# Patient Record
Sex: Female | Born: 2000 | Race: White | Hispanic: No | Marital: Single | Smoking: Former smoker
Health system: Southern US, Community
[De-identification: ages and names within clinical notes are randomized; demographics above are authoritative.]

## PROBLEM LIST (undated history)

## (undated) DIAGNOSIS — F909 Attention-deficit hyperactivity disorder, unspecified type: Secondary | ICD-10-CM

## (undated) DIAGNOSIS — F419 Anxiety disorder, unspecified: Secondary | ICD-10-CM

## (undated) DIAGNOSIS — F319 Bipolar disorder, unspecified: Secondary | ICD-10-CM

## (undated) DIAGNOSIS — G43909 Migraine, unspecified, not intractable, without status migrainosus: Secondary | ICD-10-CM

## (undated) HISTORY — DX: Bipolar disorder, unspecified: F31.9

## (undated) HISTORY — DX: Anxiety disorder, unspecified: F41.9

## (undated) HISTORY — PX: TONSILLECTOMY: SUR1361

## (undated) HISTORY — PX: WISDOM TOOTH EXTRACTION: SHX21

## (undated) HISTORY — DX: Migraine, unspecified, not intractable, without status migrainosus: G43.909

---
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
## 2021-01-09 ENCOUNTER — Ambulatory Visit (INDEPENDENT_AMBULATORY_CARE_PROVIDER_SITE_OTHER): Payer: Medicaid Other | Admitting: Family Medicine

## 2021-01-09 ENCOUNTER — Other Ambulatory Visit: Payer: Self-pay

## 2021-01-09 ENCOUNTER — Encounter: Payer: Self-pay | Admitting: Family Medicine

## 2021-01-09 VITALS — BP 118/70 | HR 77 | Temp 97.7°F | Ht 62.0 in | Wt 112.4 lb

## 2021-01-09 DIAGNOSIS — Z113 Encounter for screening for infections with a predominantly sexual mode of transmission: Secondary | ICD-10-CM

## 2021-01-09 DIAGNOSIS — Z3041 Encounter for surveillance of contraceptive pills: Secondary | ICD-10-CM

## 2021-01-09 MED ORDER — DROSPIRENONE-ETHINYL ESTRADIOL 3-0.02 MG PO TABS: 1.0000 | ORAL_TABLET | Freq: Every day | ORAL | 3 refills | Status: DC
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
## 2021-01-09 NOTE — Patient Instructions (Addendum)
Schedule lab appt for testing   Heating pad 10-15 min 2-3x/day Ibuprofen 600mg  2x/day with food x 5 days then as needed

## 2021-01-10 ENCOUNTER — Encounter: Payer: Self-pay | Admitting: Family Medicine

## 2021-01-21 ENCOUNTER — Telehealth: Payer: Self-pay | Admitting: Family Medicine
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
## 2021-01-22 ENCOUNTER — Encounter: Payer: Self-pay | Admitting: Family Medicine

## 2021-01-22 NOTE — Telephone Encounter (Signed)
Yes I will need providers office note from [facility] along with all xray and lab results. MRI will likely not be covered unless abnormal xray or abnormal neuro findings on exam

## 2021-01-22 NOTE — Telephone Encounter (Signed)
Patient's mother notified VIA phone and will drop off the information to [place] so provider can review them and make a decision form there.  Dm/cma

## 2021-01-22 NOTE — Telephone Encounter (Signed)
Spoke to patient's mother, Morrie Sheldon, again and she was very aggrivated that she is having to "jump through hoops" to get her daughter taking care of. Advised her that I am just relaying the information given to me.  She doesn't understand why we can't just order the MRI and not worry about the insurance claim information.   She will bring the information to the office so provider can have to order the MRI and she states that "she will be changing providers".  Dm/cma

## 2021-01-23 NOTE — Telephone Encounter (Signed)
Danford Bad,  Pt also sent myChart message to switch to Dr. Carmelia Roller which I forwarded to you.  I am fine with her to switch PCP.  It is more than an insurance issue with the MRI. The question is if it is medically necessary. If there is an abnormality on xray or pt has abnormal nuero exam, I have no issue with MRI. If not, then she likely needs more conservative treatment - meds, PT, etc - for a few weeks then re-assessment.

## 2021-01-23 NOTE — Telephone Encounter (Signed)
Ok for pt to schedule with Dr. Carmelia Roller.

## 2021-01-24 NOTE — Telephone Encounter (Signed)
Denise and I discussed that and I believe Denised advised the mother of it as well. Pt is scheduled 01/29/2021 with Dr. Carmelia Roller.

## 2021-01-29 ENCOUNTER — Ambulatory Visit (INDEPENDENT_AMBULATORY_CARE_PROVIDER_SITE_OTHER): Payer: Medicaid Other | Admitting: Family Medicine

## 2021-01-29 ENCOUNTER — Encounter: Payer: Self-pay | Admitting: Family Medicine

## 2021-01-29 ENCOUNTER — Other Ambulatory Visit: Payer: Self-pay

## 2021-01-29 VITALS — BP 110/76 | HR 91 | Temp 98.3°F | Ht 62.0 in | Wt 112.4 lb

## 2021-01-29 DIAGNOSIS — M545 Low back pain, unspecified: Secondary | ICD-10-CM

## 2021-01-29 DIAGNOSIS — M542 Cervicalgia: Secondary | ICD-10-CM | POA: Diagnosis not present

## 2021-01-29 MED ORDER — MELOXICAM 15 MG PO TABS: 15.0000 mg | ORAL_TABLET | Freq: Every day | ORAL | 0 refills | Status: DC

## 2021-01-29 NOTE — Patient Instructions (Addendum)
Heat (pad or rice pillow in microwave) over affected area, 10-15 minutes twice daily.   Ice/cold pack over area for 10-15 min twice daily.  OK to take Tylenol 1000 mg (2 extra strength tabs) or 975 mg (3 regular strength tabs) every 6 hours as needed.  Take Flexeril (cyclobenzaprine) 1-2 hours before planned bedtime. If it makes you drowsy, do not take during the day. You can try half a tab the following night.  Let us know if you need anything.  EXERCISES RANGE OF MOTION (ROM) AND STRETCHING EXERCISES  These exercises may help you when beginning to rehabilitate your issue. In order to successfully resolve your symptoms, you must improve your posture. These exercises are designed to help reduce the forward-head and rounded-shoulder posture which contributes to this condition. Your symptoms may resolve with or without further involvement from your physician, physical therapist or athletic trainer. While completing these exercises, remember:   Restoring tissue flexibility helps normal motion to return to the joints. This allows healthier, less painful movement and activity.  An effective stretch should be held for at least 20 seconds, although you may need to begin with shorter hold times for comfort.  A stretch should never be painful. You should only feel a gentle lengthening or release in the stretched tissue.  Do not do any stretch or exercise that you cannot tolerate.  STRETCH- Axial Extensors  Lie on your back on the floor. You may bend your knees for comfort. Place a rolled-up hand towel or dish towel, about 2 inches in diameter, under the part of your head that makes contact with the floor.  Gently tuck your chin, as if trying to make a "double chin," until you feel a gentle stretch at the base of your head.  Hold 15-20 seconds. Repeat 2-3 times. Complete this exercise 1 time per day.   STRETCH - Axial Extension   Stand or sit on a firm surface. Assume a good posture: chest up,  shoulders drawn back, abdominal muscles slightly tense, knees unlocked (if standing) and feet hip width apart.  Slowly retract your chin so your head slides back and your chin slightly lowers. Continue to look straight ahead.  You should feel a gentle stretch in the back of your head. Be certain not to feel an aggressive stretch since this can cause headaches later.  Hold for 15-20 seconds. Repeat 2-3 times. Complete this exercise 1 time per day.  STRETCH - Cervical Side Bend   Stand or sit on a firm surface. Assume a good posture: chest up, shoulders drawn back, abdominal muscles slightly tense, knees unlocked (if standing) and feet hip width apart.  Without letting your nose or shoulders move, slowly tip your right / left ear to your shoulder until your feel a gentle stretch in the muscles on the opposite side of your neck.  Hold 15-20 seconds. Repeat 2-3 times. Complete this exercise 1-2 times per day.  STRETCH - Cervical Rotators   Stand or sit on a firm surface. Assume a good posture: chest up, shoulders drawn back, abdominal muscles slightly tense, knees unlocked (if standing) and feet hip width apart.  Keeping your eyes level with the ground, slowly turn your head until you feel a gentle stretch along the back and opposite side of your neck.  Hold 15-20 seconds. Repeat 2-3 times. Complete this exercise 1-2 times per day.  RANGE OF MOTION - Neck Circles   Stand or sit on a firm surface. Assume a good posture: chest  up, shoulders drawn back, abdominal muscles slightly tense, knees unlocked (if standing) and feet hip width apart.  Gently roll your head down and around from the back of one shoulder to the back of the other. The motion should never be forced or painful.  Repeat the motion 10-20 times, or until you feel the neck muscles relax and loosen. Repeat 2-3 times. Complete the exercise 1-2 times per day. STRENGTHENING EXERCISES - Cervical Strain and Sprain These exercises  may help you when beginning to rehabilitate your injury. They may resolve your symptoms with or without further involvement from your physician, physical therapist, or athletic trainer. While completing these exercises, remember:   Muscles can gain both the endurance and the strength needed for everyday activities through controlled exercises.  Complete these exercises as instructed by your physician, physical therapist, or athletic trainer. Progress the resistance and repetitions only as guided.  You may experience muscle soreness or fatigue, but the pain or discomfort you are trying to eliminate should never worsen during these exercises. If this pain does worsen, stop and make certain you are following the directions exactly. If the pain is still present after adjustments, discontinue the exercise until you can discuss the trouble with your clinician.  STRENGTH - Cervical Flexors, Isometric  Face a wall, standing about 6 inches away. Place a small pillow, a ball about 6-8 inches in diameter, or a folded towel between your forehead and the wall.  Slightly tuck your chin and gently push your forehead into the soft object. Push only with mild to moderate intensity, building up tension gradually. Keep your jaw and forehead relaxed.  Hold 10 to 20 seconds. Keep your breathing relaxed.  Release the tension slowly. Relax your neck muscles completely before you start the next repetition. Repeat 2-3 times. Complete this exercise 1 time per day.  STRENGTH- Cervical Lateral Flexors, Isometric   Stand about 6 inches away from a wall. Place a small pillow, a ball about 6-8 inches in diameter, or a folded towel between the side of your head and the wall.  Slightly tuck your chin and gently tilt your head into the soft object. Push only with mild to moderate intensity, building up tension gradually. Keep your jaw and forehead relaxed.  Hold 10 to 20 seconds. Keep your breathing relaxed.  Release the  tension slowly. Relax your neck muscles completely before you start the next repetition. Repeat 2-3 times. Complete this exercise 1 time per day.  STRENGTH - Cervical Extensors, Isometric   Stand about 6 inches away from a wall. Place a small pillow, a ball about 6-8 inches in diameter, or a folded towel between the back of your head and the wall.  Slightly tuck your chin and gently tilt your head back into the soft object. Push only with mild to moderate intensity, building up tension gradually. Keep your jaw and forehead relaxed.  Hold 10 to 20 seconds. Keep your breathing relaxed.  Release the tension slowly. Relax your neck muscles completely before you start the next repetition. Repeat 2-3 times. Complete this exercise 1 time per day.  POSTURE AND BODY MECHANICS CONSIDERATIONS Keeping correct posture when sitting, standing or completing your activities will reduce the stress put on different body tissues, allowing injured tissues a chance to heal and limiting painful experiences. The following are general guidelines for improved posture. Your physician or physical therapist will provide you with any instructions specific to your needs. While reading these guidelines, remember:  The exercises prescribed by  your provider will help you have the flexibility and strength to maintain correct postures.  The correct posture provides the optimal environment for your joints to work. All of your joints have less wear and tear when properly supported by a spine with good posture. This means you will experience a healthier, less painful body.  Correct posture must be practiced with all of your activities, especially prolonged sitting and standing. Correct posture is as important when doing repetitive low-stress activities (typing) as it is when doing a single heavy-load activity (lifting).  PROLONGED STANDING WHILE SLIGHTLY LEANING FORWARD When completing a task that requires you to lean forward  while standing in one place for a long time, place either foot up on a stationary 2- to 4-inch high object to help maintain the best posture. When both feet are on the ground, the low back tends to lose its slight inward curve. If this curve flattens (or becomes too large), then the back and your other joints will experience too much stress, fatigue more quickly, and can cause pain.   RESTING POSITIONS Consider which positions are most painful for you when choosing a resting position. If you have pain with flexion-based activities (sitting, bending, stooping, squatting), choose a position that allows you to rest in a less flexed posture. You would want to avoid curling into a fetal position on your side. If your pain worsens with extension-based activities (prolonged standing, working overhead), avoid resting in an extended position such as sleeping on your stomach. Most people will find more comfort when they rest with their spine in a more neutral position, neither too rounded nor too arched. Lying on a non-sagging bed on your side with a pillow between your knees, or on your back with a pillow under your knees will often provide some relief. Keep in mind, being in any one position for a prolonged period of time, no matter how correct your posture, can still lead to stiffness.  WALKING Walk with an upright posture. Your ears, shoulders, and hips should all line up. OFFICE WORK When working at a desk, create an environment that supports good, upright posture. Without extra support, muscles fatigue and lead to excessive strain on joints and other tissues.  CHAIR:  A chair should be able to slide under your desk when your back makes contact with the back of the chair. This allows you to work closely.  The chair's height should allow your eyes to be level with the upper part of your monitor and your hands to be slightly lower than your elbows.  Body position: ? Your feet should make contact with the  floor. If this is not possible, use a foot rest. ? Keep your ears over your shoulders. This will reduce stress on your neck and low back.  EXERCISES  RANGE OF MOTION (ROM) AND STRETCHING EXERCISES - Low Back Pain Most people with lower back pain will find that their symptoms get worse with excessive bending forward (flexion) or arching at the lower back (extension). The exercises that will help resolve your symptoms will focus on the opposite motion.  If you have pain, numbness or tingling which travels down into your buttocks, leg or foot, the goal of the therapy is for these symptoms to move closer to your back and eventually resolve. Sometimes, these leg symptoms will get better, but your lower back pain may worsen. This is often an indication of progress in your rehabilitation. Be very alert to any changes in your symptoms and  the activities in which you participated in the 24 hours prior to the change. Sharing this information with your caregiver will allow him or her to most efficiently treat your condition. These exercises may help you when beginning to rehabilitate your injury. Your symptoms may resolve with or without further involvement from your physician, physical therapist or athletic trainer. While completing these exercises, remember:   Restoring tissue flexibility helps normal motion to return to the joints. This allows healthier, less painful movement and activity.  An effective stretch should be held for at least 30 seconds.  A stretch should never be painful. You should only feel a gentle lengthening or release in the stretched tissue. FLEXION RANGE OF MOTION AND STRETCHING EXERCISES:  STRETCH - Flexion, Single Knee to Chest   Lie on a firm bed or floor with both legs extended in front of you.  Keeping one leg in contact with the floor, bring your opposite knee to your chest. Hold your leg in place by either grabbing behind your thigh or at your knee.  Pull until you feel a  gentle stretch in your low back. Hold 30 seconds.  Slowly release your grasp and repeat the exercise with the opposite side. Repeat 2 times. Complete this exercise 3 times per week.   STRETCH - Flexion, Double Knee to Chest  Lie on a firm bed or floor with both legs extended in front of you.  Keeping one leg in contact with the floor, bring your opposite knee to your chest.  Tense your stomach muscles to support your back and then lift your other knee to your chest. Hold your legs in place by either grabbing behind your thighs or at your knees.  Pull both knees toward your chest until you feel a gentle stretch in your low back. Hold 30 seconds.  Tense your stomach muscles and slowly return one leg at a time to the floor. Repeat 2 times. Complete this exercise 3 times per week.   STRETCH - Low Trunk Rotation  Lie on a firm bed or floor. Keeping your legs in front of you, bend your knees so they are both pointed toward the ceiling and your feet are flat on the floor.  Extend your arms out to the side. This will stabilize your upper body by keeping your shoulders in contact with the floor.  Gently and slowly drop both knees together to one side until you feel a gentle stretch in your low back. Hold for 30 seconds.  Tense your stomach muscles to support your lower back as you bring your knees back to the starting position. Repeat the exercise to the other side. Repeat 2 times. Complete this exercise at least 3 times per week.   EXTENSION RANGE OF MOTION AND FLEXIBILITY EXERCISES:  STRETCH - Extension, Prone on Elbows   Lie on your stomach on the floor, a bed will be too soft. Place your palms about shoulder width apart and at the height of your head.  Place your elbows under your shoulders. If this is too painful, stack pillows under your chest.  Allow your body to relax so that your hips drop lower and make contact more completely with the floor.  Hold this position for 30  seconds.  Slowly return to lying flat on the floor. Repeat 2 times. Complete this exercise 3 times per week.   RANGE OF MOTION - Extension, Prone Press Ups  Lie on your stomach on the floor, a bed will be too soft.  Place your palms about shoulder width apart and at the height of your head.  Keeping your back as relaxed as possible, slowly straighten your elbows while keeping your hips on the floor. You may adjust the placement of your hands to maximize your comfort. As you gain motion, your hands will come more underneath your shoulders.  Hold this position 30 seconds.  Slowly return to lying flat on the floor. Repeat 2 times. Complete this exercise 3 times per week.   RANGE OF MOTION- Quadruped, Neutral Spine   Assume a hands and knees position on a firm surface. Keep your hands under your shoulders and your knees under your hips. You may place padding under your knees for comfort.  Drop your head and point your tailbone toward the ground below you. This will round out your lower back like an angry cat. Hold this position for 30 seconds.  Slowly lift your head and release your tail bone so that your back sags into a large arch, like an old horse.  Hold this position for 30 seconds.  Repeat this until you feel limber in your low back.  Now, find your "sweet spot." This will be the most comfortable position somewhere between the two previous positions. This is your neutral spine. Once you have found this position, tense your stomach muscles to support your low back.  Hold this position for 30 seconds. Repeat 2 times. Complete this exercise 3 times per week.   STRENGTHENING EXERCISES - Low Back Sprain These exercises may help you when beginning to rehabilitate your injury. These exercises should be done near your "sweet spot." This is the neutral, low-back arch, somewhere between fully rounded and fully arched, that is your least painful position. When performed in this safe range of  motion, these exercises can be used for people who have either a flexion or extension based injury. These exercises may resolve your symptoms with or without further involvement from your physician, physical therapist or athletic trainer. While completing these exercises, remember:   Muscles can gain both the endurance and the strength needed for everyday activities through controlled exercises.  Complete these exercises as instructed by your physician, physical therapist or athletic trainer. Increase the resistance and repetitions only as guided.  You may experience muscle soreness or fatigue, but the pain or discomfort you are trying to eliminate should never worsen during these exercises. If this pain does worsen, stop and make certain you are following the directions exactly. If the pain is still present after adjustments, discontinue the exercise until you can discuss the trouble with your caregiver.  STRENGTHENING - Deep Abdominals, Pelvic Tilt   Lie on a firm bed or floor. Keeping your legs in front of you, bend your knees so they are both pointed toward the ceiling and your feet are flat on the floor.  Tense your lower abdominal muscles to press your low back into the floor. This motion will rotate your pelvis so that your tail bone is scooping upwards rather than pointing at your feet or into the floor. With a gentle tension and even breathing, hold this position for 3 seconds. Repeat 2 times. Complete this exercise 3 times per week.   STRENGTHENING - Abdominals, Crunches   Lie on a firm bed or floor. Keeping your legs in front of you, bend your knees so they are both pointed toward the ceiling and your feet are flat on the floor. Cross your arms over your chest.  Slightly tip your chin  down without bending your neck.  Tense your abdominals and slowly lift your trunk high enough to just clear your shoulder blades. Lifting higher can put excessive stress on the lower back and does not  further strengthen your abdominal muscles.  Control your return to the starting position. Repeat 2 times. Complete this exercise 3 times per week.   STRENGTHENING - Quadruped, Opposite UE/LE Lift   Assume a hands and knees position on a firm surface. Keep your hands under your shoulders and your knees under your hips. You may place padding under your knees for comfort.  Find your neutral spine and gently tense your abdominal muscles so that you can maintain this position. Your shoulders and hips should form a rectangle that is parallel with the floor and is not twisted.  Keeping your trunk steady, lift your right hand no higher than your shoulder and then your left leg no higher than your hip. Make sure you are not holding your breath. Hold this position for 30 seconds.  Continuing to keep your abdominal muscles tense and your back steady, slowly return to your starting position. Repeat with the opposite arm and leg. Repeat 2 times. Complete this exercise 3 times per week.   STRENGTHENING - Abdominals and Quadriceps, Straight Leg Raise   Lie on a firm bed or floor with both legs extended in front of you.  Keeping one leg in contact with the floor, bend the other knee so that your foot can rest flat on the floor.  Find your neutral spine, and tense your abdominal muscles to maintain your spinal position throughout the exercise.  Slowly lift your straight leg off the floor about 6 inches for a count of 3, making sure to not hold your breath.  Still keeping your neutral spine, slowly lower your leg all the way to the floor. Repeat this exercise with each leg 2 times. Complete this exercise 3 times per week.  POSTURE AND BODY MECHANICS CONSIDERATIONS - Low Back Sprain Keeping correct posture when sitting, standing or completing your activities will reduce the stress put on different body tissues, allowing injured tissues a chance to heal and limiting painful experiences. The following are  general guidelines for improved posture.  While reading these guidelines, remember:  The exercises prescribed by your provider will help you have the flexibility and strength to maintain correct postures.  The correct posture provides the best environment for your joints to work. All of your joints have less wear and tear when properly supported by a spine with good posture. This means you will experience a healthier, less painful body.  Correct posture must be practiced with all of your activities, especially prolonged sitting and standing. Correct posture is as important when doing repetitive low-stress activities (typing) as it is when doing a single heavy-load activity (lifting).  RESTING POSITIONS Consider which positions are most painful for you when choosing a resting position. If you have pain with flexion-based activities (sitting, bending, stooping, squatting), choose a position that allows you to rest in a less flexed posture. You would want to avoid curling into a fetal position on your side. If your pain worsens with extension-based activities (prolonged standing, working overhead), avoid resting in an extended position such as sleeping on your stomach. Most people will find more comfort when they rest with their spine in a more neutral position, neither too rounded nor too arched. Lying on a non-sagging bed on your side with a pillow between your knees, or on your  back with a pillow under your knees will often provide some relief. Keep in mind, being in any one position for a prolonged period of time, no matter how correct your posture, can still lead to stiffness.  PROPER SITTING POSTURE In order to minimize stress and discomfort on your spine, you must sit with correct posture. Sitting with good posture should be effortless for a healthy body. Returning to good posture is a gradual process. Many people can work toward this most comfortably by using various supports until they have the  flexibility and strength to maintain this posture on their own. When sitting with proper posture, your ears will fall over your shoulders and your shoulders will fall over your hips. You should use the back of the chair to support your upper back. Your lower back will be in a neutral position, just slightly arched. You may place a small pillow or folded towel at the base of your lower back for  support.  When working at a desk, create an environment that supports good, upright posture. Without extra support, muscles tire, which leads to excessive strain on joints and other tissues. Keep these recommendations in mind:  CHAIR:  A chair should be able to slide under your desk when your back makes contact with the back of the chair. This allows you to work closely.  The chair's height should allow your eyes to be level with the upper part of your monitor and your hands to be slightly lower than your elbows.  BODY POSITION  Your feet should make contact with the floor. If this is not possible, use a foot rest.  Keep your ears over your shoulders. This will reduce stress on your neck and low back.  INCORRECT SITTING POSTURES  If you are feeling tired and unable to assume a healthy sitting posture, do not slouch or slump. This puts excessive strain on your back tissues, causing more damage and pain. Healthier options include:  Using more support, like a lumbar pillow.  Switching tasks to something that requires you to be upright or walking.  Talking a brief walk.  Lying down to rest in a neutral-spine position.  PROLONGED STANDING WHILE SLIGHTLY LEANING FORWARD  When completing a task that requires you to lean forward while standing in one place for a long time, place either foot up on a stationary 2-4 inch high object to help maintain the best posture. When both feet are on the ground, the lower back tends to lose its slight inward curve. If this curve flattens (or becomes too large), then the  back and your other joints will experience too much stress, tire more quickly, and can cause pain.  CORRECT STANDING POSTURES Proper standing posture should be assumed with all daily activities, even if they only take a few moments, like when brushing your teeth. As in sitting, your ears should fall over your shoulders and your shoulders should fall over your hips. You should keep a slight tension in your abdominal muscles to brace your spine. Your tailbone should point down to the ground, not behind your body, resulting in an over-extended swayback posture.   INCORRECT STANDING POSTURES  Common incorrect standing postures include a forward head, locked knees and/or an excessive swayback. WALKING Walk with an upright posture. Your ears, shoulders and hips should all line-up.  PROLONGED ACTIVITY IN A FLEXED POSITION When completing a task that requires you to bend forward at your waist or lean over a low surface, try to find  a way to stabilize 3 out of 4 of your limbs. You can place a hand or elbow on your thigh or rest a knee on the surface you are reaching across. This will provide you more stability, so that your muscles do not tire as quickly. By keeping your knees relaxed, or slightly bent, you will also reduce stress across your lower back. CORRECT LIFTING TECHNIQUES  DO :  Assume a wide stance. This will provide you more stability and the opportunity to get as close as possible to the object which you are lifting.  Tense your abdominals to brace your spine. Bend at the knees and hips. Keeping your back locked in a neutral-spine position, lift using your leg muscles. Lift with your legs, keeping your back straight.  Test the weight of unknown objects before attempting to lift them.  Try to keep your elbows locked down at your sides in order get the best strength from your shoulders when carrying an object.     Always ask for help when lifting heavy or awkward objects. INCORRECT  LIFTING TECHNIQUES DO NOT:   Lock your knees when lifting, even if it is a small object.  Bend and twist. Pivot at your feet or move your feet when needing to change directions.  Assume that you can safely pick up even a paperclip without proper posture.
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
## 2021-01-31 ENCOUNTER — Ambulatory Visit: Payer: Medicaid Other | Attending: Family Medicine | Admitting: Physical Therapy

## 2021-01-31 ENCOUNTER — Encounter: Payer: Self-pay | Admitting: Physical Therapy

## 2021-01-31 ENCOUNTER — Other Ambulatory Visit: Payer: Self-pay

## 2021-01-31 DIAGNOSIS — R262 Difficulty in walking, not elsewhere classified: Secondary | ICD-10-CM | POA: Diagnosis present

## 2021-01-31 DIAGNOSIS — M6281 Muscle weakness (generalized): Secondary | ICD-10-CM | POA: Insufficient documentation

## 2021-01-31 DIAGNOSIS — M542 Cervicalgia: Secondary | ICD-10-CM | POA: Diagnosis present

## 2021-01-31 DIAGNOSIS — M545 Low back pain, unspecified: Secondary | ICD-10-CM | POA: Diagnosis present

## 2021-01-31 DIAGNOSIS — R293 Abnormal posture: Secondary | ICD-10-CM | POA: Insufficient documentation
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
## 2021-02-07 ENCOUNTER — Ambulatory Visit: Payer: Medicaid Other

## 2021-02-07 ENCOUNTER — Other Ambulatory Visit: Payer: Self-pay

## 2021-02-07 DIAGNOSIS — M542 Cervicalgia: Secondary | ICD-10-CM

## 2021-02-07 DIAGNOSIS — R293 Abnormal posture: Secondary | ICD-10-CM

## 2021-02-07 DIAGNOSIS — M545 Low back pain, unspecified: Secondary | ICD-10-CM

## 2021-02-07 DIAGNOSIS — R262 Difficulty in walking, not elsewhere classified: Secondary | ICD-10-CM

## 2021-02-07 DIAGNOSIS — M6281 Muscle weakness (generalized): Secondary | ICD-10-CM

## 2021-02-07 NOTE — Therapy (Signed)
[facility] [address]  [address] [place], [place], [address] Phone: [phone]   Fax:  [phone]  Physical Therapy Treatment  Patient Details  Name: Autumn Cortez MRN: [id number] Date of Birth: 2001/11/04 Referring Provider (PT): Arva Chafe, [place]   Encounter Date: 02/07/2021   PT End of Session - 02/07/21 1828    Visit Number 2    Number of Visits 13    Date for PT Re-Evaluation 03/14/21    Authorization Type UHC Medicaid    PT Start Time 1616    PT Stop Time 1705    PT Time Calculation (min) 49 min    Activity Tolerance Patient tolerated treatment well;Patient limited by pain    Behavior During Therapy [facility] for tasks assessed/performed           Past Medical History:  Diagnosis Date  . ADD (attention deficit disorder with hyperactivity)   . Anxiety   . Asthma   . Bipolar 1 disorder (HCC)   . Migraines     Past Surgical History:  Procedure Laterality Date  . ADENOIDECTOMY  8/11  . TONSILLECTOMY      There were no vitals filed for this visit.   Subjective Assessment - 02/07/21 1623    Subjective Pt reports she has been doing the exercises but some pain in the neck with exercises. Had some questions about home program.    Pertinent History migraines, bipolar 1, asthma, anxiety, ADD    Limitations Lifting;Standing;Walking;House hold activities;Writing    How long can you sit comfortably? unlimited    How long can you stand comfortably? 1 hour    How long can you walk comfortably? 1 hour    Diagnostic tests per patient- xrays were done but unable to access    Patient Stated Goals decrease pain    Currently in Pain? Yes    Pain Score 6     Pain Location Neck    Pain Orientation Right;Left;Mid    Pain Descriptors / Indicators Sharp;Sore    Pain Type Acute pain                             [facility] Adult PT Treatment/Exercise - 02/07/21 1634      Exercises   Exercises  Neck;Lumbar;Other Exercises      Neck Exercises: Seated   Postural Training Seated in chair with vertical spinal roll and horizontal  lumbar roll for external postural cues. Chin tucks x 10 with difficulty, overactive SCM activity. Horizontal abduction with yellow TB x 5    Other Seated Exercise Cervical AROM 4 way x 10. Cervical extension towel stretch x 10, cervical rotation stretch 5x B.      Neck Exercises: Supine   Other Supine Exercise Cervical rotation AROM x 10 CW, 10 CCW. Chin tucks  x10 with 3" holds and cues to prevent compensatory head lifting using SCMs      Lumbar Exercises: Stretches   Other Lumbar Stretch Exercise Seated prayer stretch with green pball x10 fwd/r/l      Lumbar Exercises: Seated   Other Seated Lumbar Exercises Pelvic tilts x 10 seated on dynadisc      Lumbar Exercises: Supine   Bridge 10 reps   Cues for shorter range movement. Reports tension in shoulders when doing at home- originally doing large range motion wiith some lumbar hyperextension. Repsonded wellto cues.     Lumbar Exercises: Quadruped  Other Quadruped Lumbar Exercises Cat/Camel with cues for scap stabilization and lumbar extension/flexion x 10      Modalities   Modalities Cryotherapy      Cryotherapy   Number Minutes Cryotherapy 5 Minutes    Cryotherapy Location Cervical    Type of Cryotherapy Ice pack      Manual Therapy   Manual Therapy Soft tissue mobilization;Myofascial release    Soft tissue mobilization B UT/LS, suboccipitals with more TTP on the R, B SCM (R>L)                    PT Short Term Goals - 01/31/21 1621      PT SHORT TERM GOAL #1   Title Patient to be independent with initial HEP.    Time 3    Period Weeks    Status New    Target Date 02/21/21             PT Long Term Goals - 01/31/21 1622      PT LONG TERM GOAL #1   Title Patient to be independent with advanced HEP.    Time 6    Period Weeks    Status New    Target Date 03/14/21      PT  LONG TERM GOAL #2   Title Patient to demonstrate B UE and LE strength >/=4+/5.    Time 6    Period Weeks    Status New    Target Date 03/14/21      PT LONG TERM GOAL #3   Title Patient to demonstrate cervical AROM WFL and without pain limiting.    Time 6    Period Weeks    Status New    Target Date 03/14/21      PT LONG TERM GOAL #4   Title Patient to demonstrate lumbar AROM WFL and without pain limiting.    Time 6    Period Weeks    Status New    Target Date 03/14/21      PT LONG TERM GOAL #5   Title Patient to report tolerance for standing and walking without limitation to fully return to school and work duties.    Time 6    Period Weeks    Status New    Target Date 03/14/21                 Plan - 02/07/21 1705    Clinical Impression Statement Autumn Cortez tolerated treatment nicely today. Spent alot of time revieweing HEP. Autumn Cortez required cues for proper performance of scap retractions with emphasis on depression with retraction, Upper trap stretch with cue for slight chin tuck (pt with forward head posture and c/o pain otherwise at 15 seconds, pain not exacerbated after cues).  She demos limited scap staiblization with QP exercises but did well with cues. Introduced supine chin tucks and AROM with good tolerance. Alot of cues and education provided for postural corrections, sitting posture. Very overactive SCMs (R>>L) with TTP at R SCM insertion at mastoid today, responded nicelyto STM and cryo at end of session. Continue per POC    Personal Factors and Comorbidities Age;Comorbidity 3+;Fitness;Past/Current Experience;Profession;Time since onset of injury/illness/exacerbation    Comorbidities migraines, bipolar 1, asthma, anxiety, ADD    Examination-Activity Limitations Sleep;Bed Mobility;Bend;Squat;Stairs;Carry;Stand;Dressing;Transfers;Lift;Reach Overhead;Locomotion Level    Examination-Participation Restrictions Church;Cleaning;School;Shop;Community  Activity;Driving;Volunteer;Yard Work;Laundry;Meal Prep;Occupation    Rehab Potential Good    PT Frequency 2x / week    PT Duration 6 weeks  PT Treatment/Interventions ADLs/Self Care Home Management;Cryotherapy;Electrical Stimulation;Moist Heat;Traction;Balance training;Therapeutic exercise;Therapeutic activities;Functional mobility training;Stair training;Gait training;Ultrasound;Neuromuscular re-education;Patient/family education;Manual techniques;Taping;Energy conservation;Dry needling;Passive range of motion    PT Next Visit Plan continue to reassess HEP; progress UE and LE strength and cervical/lumbar mobility    Consulted and Agree with Plan of Care Patient           Patient will benefit from skilled therapeutic intervention in order to improve the following deficits and impairments:  Hypomobility,Pain,Impaired UE functional use,Increased fascial restricitons,Decreased strength,Decreased activity tolerance,Difficulty walking,Increased muscle spasms,Improper body mechanics,Decreased range of motion,Impaired flexibility,Postural dysfunction  Visit Diagnosis: Cervicalgia  Acute bilateral low back pain without sciatica  Muscle weakness (generalized)  Abnormal posture  Difficulty in walking, not elsewhere classified     Problem List Patient Active Problem List   Diagnosis Date Noted  . 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 Suicide ideation 06/27/2019  . ADHD (attention deficit hyperactivity disorder), inattentive type 06/23/2019  . Overdose 06/23/2019  . Migraine headache 06/23/2019  . MDD (major depressive disorder), recurrent, severe, with psychosis (HCC) 02/22/2018    Anson Crofts , PT, DPT 02/07/2021, 6:36 PM  [facility] [address]  [address] [place], [place], [address] Phone: [phone]   Fax:  [phone]  Name: Autumn Cortez MRN:  [id number] Date of Birth: 01/27/2001

## 2021-02-19 ENCOUNTER — Ambulatory Visit: Payer: Medicaid Other | Admitting: Physical Therapy

## 2021-02-20 ENCOUNTER — Other Ambulatory Visit: Payer: Self-pay

## 2021-02-20 ENCOUNTER — Encounter: Payer: Self-pay | Admitting: Physical Therapy

## 2021-02-20 ENCOUNTER — Ambulatory Visit: Payer: Medicaid Other | Admitting: Physical Therapy

## 2021-02-20 DIAGNOSIS — M542 Cervicalgia: Secondary | ICD-10-CM | POA: Diagnosis not present

## 2021-02-20 DIAGNOSIS — R293 Abnormal posture: Secondary | ICD-10-CM

## 2021-02-20 DIAGNOSIS — R262 Difficulty in walking, not elsewhere classified: Secondary | ICD-10-CM

## 2021-02-20 DIAGNOSIS — M6281 Muscle weakness (generalized): Secondary | ICD-10-CM

## 2021-02-20 DIAGNOSIS — M545 Low back pain, unspecified: Secondary | ICD-10-CM

## 2021-02-20 NOTE — Therapy (Signed)
[facility] [address]  [place], [name], [address] Phone: [phone]   Fax:  [phone]  Physical Therapy Treatment  Patient Details  Name: Autumn Cortez MRN: [id number] Date of Birth: [birth year] Referring Provider (PT): Riki Sheer, [place]   Encounter Date: 02/20/2021   PT End of Session - 02/20/21 1618    Visit Number 3    Number of Visits 13    Date for PT Re-Evaluation 03/14/21    Authorization Type [employer] Medicaid    PT Start Time 4585    PT Stop Time 1629    PT Time Calculation (min) 55 min    Activity Tolerance Patient tolerated treatment well;Patient limited by pain    Behavior During Therapy [facility] for tasks assessed/performed           Past Medical History:  Diagnosis Date  . ADD (attention deficit disorder with hyperactivity)   . Anxiety   . Asthma   . Bipolar 1 disorder ([place])   . Migraines     Past Surgical History:  Procedure Laterality Date  . ADENOIDECTOMY  8/11  . TONSILLECTOMY      There were no vitals filed for this visit.   Subjective Assessment - 02/20/21 1535    Subjective "Getting a little better slowly but surely." Reports consistently performing HEP- denies questions.    Pertinent History migraines, bipolar 1, asthma, anxiety, ADD    Diagnostic tests per patient- xrays were done but unable to access    Patient Stated Goals decrease pain    Currently in Pain? Yes    Pain Score 3     Pain Location Shoulder    Pain Orientation Right;Left;Upper    Pain Descriptors / Indicators Dull    Pain Type Acute pain                             [facility] Adult PT Treatment/Exercise - 02/20/21 0001      Neck Exercises: Machines for Strengthening   UBE (Upper Arm Bike) L1 x 3 min forward, 3 min back   heavy UT use     Lumbar Exercises: Supine   Bridge 10 reps    Bridge Limitations straight leg bridge   slow but controlled     Lumbar Exercises: Sidelying    Hip Abduction Right;Left;10 reps    Hip Abduction Weights (lbs) 1#   cues for alignment & to maintain quad tight   Hip Abduction Limitations R/L with 1#   cues to maintain knee straight     Lumbar Exercises: Quadruped   Other Quadruped Lumbar Exercises Cat/Camel x 10   cues for rhythmic breathing with each movement   Other Quadruped Lumbar Exercises R/L thread the needle x10 each side   good ROM     Modalities   Modalities Cryotherapy;Electrical Stimulation      Cryotherapy   Number Minutes Cryotherapy 10 Minutes    Cryotherapy Location Cervical    Type of Cryotherapy Ice pack      Electrical Stimulation   Electrical Stimulation Location B UT    Electrical Stimulation Action premod    Electrical Stimulation Parameters intensity to tolerance; 10 min    Electrical Stimulation Goals Tone;Pain      Neck Exercises: Stretches   Upper Trapezius Stretch Right;Left;1 rep;30 seconds   cues to depress shoulders   Levator Stretch Right;Left;1 rep;30 seconds  PT Short Term Goals - 02/20/21 1620      PT SHORT TERM GOAL #1   Title Patient to be independent with initial HEP.    Time 3    Period Weeks    Status Achieved    Target Date 02/21/21             PT Long Term Goals - 02/20/21 1539      PT LONG TERM GOAL #1   Title Patient to be independent with advanced HEP.    Time 6    Period Weeks    Status On-going      PT LONG TERM GOAL #2   Title Patient to demonstrate B UE and LE strength >/=4+/5.    Time 6    Period Weeks    Status On-going      PT LONG TERM GOAL #3   Title Patient to demonstrate cervical AROM WFL and without pain limiting.    Time 6    Period Weeks    Status On-going      PT LONG TERM GOAL #4   Title Patient to demonstrate lumbar AROM WFL and without pain limiting.    Time 6    Period Weeks    Status On-going      PT LONG TERM GOAL #5   Title Patient to report tolerance for standing and walking without limitation to  fully return to school and work duties.    Time 6    Period Weeks    Status Partially Met   reporting a couple of hours of standing and walking before increase in pain                Plan - 02/20/21 1619    Clinical Impression Statement Patient reporting slow but steady improvement in symptoms and noting consistent HEP compliance. Lumbopelvic and thoracolumbar AROM was performed with good tolerance- still lacking end ranges of anterior and posterior pelvic tilt. Increased challenge with bridges with care to avoid upper body pain/tension- patient tolerated well and performed with good form. Demonstrated some B adductor weakness with sidelying ther-ex. Patient noted mild tension in B UT after mat ther-ex which was addressed with gentle stretching and ended session with ice pack and e-stim to B UT. Patient reported improvement in pain levels at end of session. Patient is overall tolerating sessions well despite remaining pain.    Personal Factors and Comorbidities Age;Comorbidity 3+;Fitness;Past/Current Experience;Profession;Time since onset of injury/illness/exacerbation    Comorbidities migraines, bipolar 1, asthma, anxiety, ADD    Examination-Activity Limitations Sleep;Bed Mobility;Bend;Squat;Stairs;Carry;Stand;Dressing;Transfers;Lift;Reach Overhead;Locomotion Level    Examination-Participation Restrictions Church;Cleaning;School;Shop;Community Activity;Driving;Volunteer;Yard Work;Laundry;Meal Prep;Occupation    Rehab Potential Good    PT Frequency 2x / week    PT Duration 6 weeks    PT Treatment/Interventions ADLs/Self Care Home Management;Cryotherapy;Electrical Stimulation;Moist Heat;Traction;Balance training;Therapeutic exercise;Therapeutic activities;Functional mobility training;Stair training;Gait training;Ultrasound;Neuromuscular re-education;Patient/family education;Manual techniques;Taping;Energy conservation;Dry needling;Passive range of motion    PT Next Visit Plan progress UE and  LE strength and cervical/lumbar mobility    Consulted and Agree with Plan of Care Patient           Patient will benefit from skilled therapeutic intervention in order to improve the following deficits and impairments:  Hypomobility,Pain,Impaired UE functional use,Increased fascial restricitons,Decreased strength,Decreased activity tolerance,Difficulty walking,Increased muscle spasms,Improper body mechanics,Decreased range of motion,Impaired flexibility,Postural dysfunction  Visit Diagnosis: Cervicalgia  Acute bilateral low back pain without sciatica  Muscle weakness (generalized)  Abnormal posture  Difficulty in walking, not elsewhere classified  Problem List Patient Active Problem List   Diagnosis Date Noted  . 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 Suicide ideation 06/27/2019  . ADHD (attention deficit hyperactivity disorder), inattentive type 06/23/2019  . Overdose 06/23/2019  . Migraine headache 06/23/2019  . MDD (major depressive disorder), recurrent, severe, with psychosis ([place]) 02/22/2018     Janene Harvey, PT, DPT 02/20/21 4:27 PM   [facility] [address]  [place], [name], [address] Phone: [phone]   Fax:  [phone]  Name: Vinetta Brach MRN: [id number] Date of Birth: 08-Jul-2001

## 2021-02-25 ENCOUNTER — Ambulatory Visit: Payer: Medicaid Other | Admitting: Physical Therapy

## 2021-02-28 ENCOUNTER — Ambulatory Visit: Payer: BC Managed Care – PPO | Attending: Family Medicine | Admitting: Physical Therapy

## 2021-02-28 ENCOUNTER — Other Ambulatory Visit: Payer: Self-pay

## 2021-02-28 ENCOUNTER — Encounter: Payer: Self-pay | Admitting: Physical Therapy

## 2021-02-28 DIAGNOSIS — M542 Cervicalgia: Secondary | ICD-10-CM | POA: Diagnosis present

## 2021-02-28 DIAGNOSIS — R262 Difficulty in walking, not elsewhere classified: Secondary | ICD-10-CM | POA: Insufficient documentation

## 2021-02-28 DIAGNOSIS — M6281 Muscle weakness (generalized): Secondary | ICD-10-CM | POA: Insufficient documentation

## 2021-02-28 DIAGNOSIS — R293 Abnormal posture: Secondary | ICD-10-CM | POA: Diagnosis present

## 2021-02-28 DIAGNOSIS — M545 Low back pain, unspecified: Secondary | ICD-10-CM | POA: Insufficient documentation

## 2021-02-28 NOTE — Patient Instructions (Addendum)
Trigger Point Dry Needling  . What is Trigger Point Dry Needling (DN)? o DN is a physical therapy technique used to treat muscle pain and dysfunction. Specifically, DN helps deactivate muscle trigger points (muscle knots).  o A thin filiform needle is used to penetrate the skin and stimulate the underlying trigger point. The goal is for a local twitch response (LTR) to occur and for the trigger point to relax. No medication of any kind is injected during the procedure.   . What Does Trigger Point Dry Needling Feel Like?  o The procedure feels different for each individual patient. Some patients report that they do not actually feel the needle enter the skin and overall the process is not painful. Very mild bleeding may occur. However, many patients feel a deep cramping in the muscle in which the needle was inserted. This is the local twitch response.   Marland Kitchen How Will I feel after the treatment? o Soreness is normal, and the onset of soreness may not occur for a few hours. Typically this soreness does not last longer than two days.  o Bruising is uncommon, however; ice can be used to decrease any possible bruising.  o In rare cases feeling tired or nauseous after the treatment is normal. In addition, your symptoms may get worse before they get better, this period will typically not last longer than 24 hours.   . What Can I do After My Treatment? o Increase your hydration by drinking more water for the next 24 hours. o You may place ice or heat on the areas treated that have become sore, however, do not use heat on inflamed or bruised areas. Heat often brings more relief post needling. o You can continue your regular activities, but vigorous activity is not recommended initially after the treatment for 24 hours. o DN is best combined with other physical therapy such as strengthening, stretching, and other therapies.    TENS stands for Transcutaneous Electrical Nerve Stimulation. In other words,  electrical impulses are allowed to pass through the skin in order to excite a nerve.   Purpose and Use of TENS:  TENS is a method used to manage acute and chronic pain without the use of drugs. It has been effective in managing pain associated with surgery, sprains, strains, trauma, rheumatoid arthritis, and neuralgias. It is a non-addictive, low risk, and non-invasive technique used to control pain. It is not, by any means, a curative form of treatment.   How TENS Works:  Most TENS units are a Paramedic unit powered by one 9 volt battery. Attached to the outside of the unit are two lead wires where two pins and/or snaps connect on each wire. All units come with a set of four reusable pads or electrodes. These are placed on the skin surrounding the area involved. By inserting the leads into  the pads, the electricity can pass from the unit making the circuit complete.  As the intensity is turned up slowly, the electrical current enters the body from the electrodes through the skin to the surrounding nerve fibers. This triggers the release of hormones from within the body. These hormones contain pain relievers. By increasing the circulation of these hormones, the person's pain may be lessened. It is also believed that the electrical stimulation itself helps to block the pain messages being sent to the brain, thus also decreasing the body's perception of pain.   Hazards:  TENS units are NOT to be used by patients with PACEMAKERS, DEFIBRILLATORS,  DIABETIC PUMPS, PREGNANT WOMEN, and patients with SEIZURE DISORDERS.  TENS units are NOT to be used over the heart, throat, brain, or spinal cord.  One of the major side effects from the TENS unit may be skin irritation. Some people may develop a rash if they are sensitive to the materials used in the electrodes or the connecting wires.   Wear the unit for 15 minutes.   Avoid overuse due the body getting used to the stem making it not as effective over  time.     TENS UNIT  This is helpful for muscle pain and spasm.   Search and Purchase a TENS 7000 2nd edition at www.tenspros.com or www.amazon.com  (It should be less than $30)     TENS unit instructions:   Do not shower or bathe with the unit on  Turn the unit off before removing electrodes or batteries  If the electrodes lose stickiness add a drop of water to the electrodes after they are disconnected from the unit and place on plastic sheet. If you continued to have difficulty, call the TENS unit company to purchase more electrodes.  Do not apply lotion on the skin area prior to use. Make sure the skin is clean and dry as this will help prolong the life of the electrodes.  After use, always check skin for unusual red areas, rash or other skin difficulties. If there are any skin problems, does not apply electrodes to the same area.  Never remove the electrodes from the unit by pulling the wires.  Do not use the TENS unit or electrodes other than as directed.  Do not change electrode placement without consulting your therapist or physician.  Keep 2 fingers with between each electrode.

## 2021-02-28 NOTE — Therapy (Signed)
[facility] [address]  [place], [name], [address] Phone: [phone]   Fax:  [phone]  Physical Therapy Treatment  Patient Details  Name: Autumn Cortez MRN: [id number] Date of Birth: 2001/11/28 Referring Provider (PT): Riki Sheer, DO   Encounter Date: 02/28/2021   PT End of Session - 02/28/21 1622    Visit Number 4    Number of Visits 13    Date for PT Re-Evaluation 03/14/21    Authorization Type [employer] Medicaid    PT Start Time 8250   pt late   PT Stop Time 1628    PT Time Calculation (min) 52 min    Activity Tolerance Patient tolerated treatment well    Behavior During Therapy [facility] for tasks assessed/performed           Past Medical History:  Diagnosis Date  . ADD (attention deficit disorder with hyperactivity)   . Anxiety   . Asthma   . Bipolar 1 disorder ([place])   . Migraines     Past Surgical History:  Procedure Laterality Date  . ADENOIDECTOMY  8/11  . TONSILLECTOMY      There were no vitals filed for this visit.   Subjective Assessment - 02/28/21 1539    Subjective Neck has been bothering her lately as well as a spot on her midback.    Pertinent History migraines, bipolar 1, asthma, anxiety, ADD    Diagnostic tests per patient- xrays were done but unable to access    Patient Stated Goals decrease pain    Currently in Pain? Yes    Pain Score 4     Pain Location Neck    Pain Orientation Right;Left;Posterior    Pain Descriptors / Indicators Aching    Pain Type Acute pain    Pain Score 5    Pain Location Back    Pain Orientation Medial;Mid;Right    Pain Descriptors / Indicators Aching    Pain Type Acute pain                             [facility] Adult PT Treatment/Exercise - 02/28/21 0001      Self-Care   Self-Care Other Self-Care Comments    Other Self-Care Comments  edu and demonstration and practice using ball on wall self-STM to R thoracic  paraspinals for pain relief      Lumbar Exercises: Aerobic   Nustep L4 x 6 min (UEs/LEs)      Lumbar Exercises: Prone   Other Prone Lumbar Exercises child's pose 5x10" forward, 5x10" to L    Other Prone Lumbar Exercises prone on elbows 10x3"      Modalities   Modalities Moist Heat      Moist Heat Therapy   Number Minutes Moist Heat 15 Minutes    Moist Heat Location --   thoracic spine     Electrical Stimulation   Electrical Stimulation Location B thoracic paraspinals    Electrical Stimulation Action IFC    Electrical Stimulation Parameters intensity to tolerance; 15 min    Electrical Stimulation Goals Tone;Pain      Manual Therapy   Manual Therapy Soft tissue mobilization;Myofascial release;Joint mobilization    Manual therapy comments prone    Joint Mobilization gentle central PAs along T1-10 grade III to tolerance   hypomobility more evident in upper/mid T-spine; TTP throughout   Soft tissue mobilization STM to R thoracic paraspinals and rhomboids  Myofascial Release manual TPR to R thoracic paraspinals and rhomboids   TTP taut bands of muscle throughout                 PT Education - 02/28/21 1602    Education Details edu on DN benefits, use, typical treatment course and response; Educated patient on personal TENS use, benefits, electrode placement, and safety information    Person(s) Educated Patient    Methods Explanation;Demonstration;Tactile cues;Verbal cues;Handout    Comprehension Verbalized understanding;Returned demonstration            PT Short Term Goals - 02/20/21 1620      PT SHORT TERM GOAL #1   Title Patient to be independent with initial HEP.    Time 3    Period Weeks    Status Achieved    Target Date 02/21/21             PT Long Term Goals - 02/20/21 1539      PT LONG TERM GOAL #1   Title Patient to be independent with advanced HEP.    Time 6    Period Weeks    Status On-going      PT LONG TERM GOAL #2   Title Patient to  demonstrate B UE and LE strength >/=4+/5.    Time 6    Period Weeks    Status On-going      PT LONG TERM GOAL #3   Title Patient to demonstrate cervical AROM WFL and without pain limiting.    Time 6    Period Weeks    Status On-going      PT LONG TERM GOAL #4   Title Patient to demonstrate lumbar AROM WFL and without pain limiting.    Time 6    Period Weeks    Status On-going      PT LONG TERM GOAL #5   Title Patient to report tolerance for standing and walking without limitation to fully return to school and work duties.    Time 6    Period Weeks    Status Partially Met   reporting a couple of hours of standing and walking before increase in pain                Plan - 02/28/21 1623    Clinical Impression Statement Patient arrived to session with report of neck and R midback pain today without known cause. Worked on addressed soft tissue restriction located over R rhomboids and paraspinals. Patient reported TTP and demonstrated considerable taut bands of muscle throughout. Educated patient on DN at this area may benefit from this modality. Worked on thoracic joint mobility with most hypomobility evident in upper/mid T-spine and with TTP throughout. Patient performed thoracolumbar mobility ther-ex to continue addressing ROM deficits with good tolerance. Ended session with e-stim and moist heat to B thoracic paraspinals to address remaining pain. Educated patient on personal TENS use, benefits, electrode placement, and safety information. Patient reported understanding and without complaints at end of session.    Personal Factors and Comorbidities Age;Comorbidity 3+;Fitness;Past/Current Experience;Profession;Time since onset of injury/illness/exacerbation    Comorbidities migraines, bipolar 1, asthma, anxiety, ADD    Examination-Activity Limitations Sleep;Bed Mobility;Bend;Squat;Stairs;Carry;Stand;Dressing;Transfers;Lift;Reach Overhead;Locomotion Level    Examination-Participation  Restrictions Church;Cleaning;School;Shop;Community Activity;Driving;Volunteer;Yard Work;Laundry;Meal Prep;Occupation    Rehab Potential Good    PT Frequency 2x / week    PT Duration 6 weeks    PT Treatment/Interventions ADLs/Self Care Home Management;Cryotherapy;Electrical Stimulation;Moist Heat;Traction;Balance training;Therapeutic exercise;Therapeutic activities;Functional mobility training;Stair training;Gait training;Ultrasound;Neuromuscular re-education;Patient/family  education;Manual techniques;Taping;Energy conservation;Dry needling;Passive range of motion    PT Next Visit Plan progress UE and LE strength and cervical/lumbar mobility    Consulted and Agree with Plan of Care Patient           Patient will benefit from skilled therapeutic intervention in order to improve the following deficits and impairments:  Hypomobility,Pain,Impaired UE functional use,Increased fascial restricitons,Decreased strength,Decreased activity tolerance,Difficulty walking,Increased muscle spasms,Improper body mechanics,Decreased range of motion,Impaired flexibility,Postural dysfunction  Visit Diagnosis: Cervicalgia  Acute bilateral low back pain without sciatica  Muscle weakness (generalized)  Abnormal posture  Difficulty in walking, not elsewhere classified     Problem List Patient Active Problem List   Diagnosis Date Noted  . 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 Suicide ideation 06/27/2019  . ADHD (attention deficit hyperactivity disorder), inattentive type 06/23/2019  . Overdose 06/23/2019  . Migraine headache 06/23/2019  . MDD (major depressive disorder), recurrent, severe, with psychosis ([place]) 02/22/2018     Janene Harvey, PT, DPT 02/28/21 4:47 PM   [facility] [address]  [place], [name], [address] Phone: [phone]   Fax:  [phone]  Name: Brettney Ficken MRN: [id number] Date of Birth: 08/06/2001

## 2021-03-04 ENCOUNTER — Other Ambulatory Visit: Payer: Self-pay

## 2021-03-04 ENCOUNTER — Ambulatory Visit: Payer: BC Managed Care – PPO

## 2021-03-04 DIAGNOSIS — M542 Cervicalgia: Secondary | ICD-10-CM | POA: Diagnosis not present

## 2021-03-04 DIAGNOSIS — M545 Low back pain, unspecified: Secondary | ICD-10-CM

## 2021-03-04 DIAGNOSIS — R293 Abnormal posture: Secondary | ICD-10-CM

## 2021-03-04 DIAGNOSIS — M6281 Muscle weakness (generalized): Secondary | ICD-10-CM

## 2021-03-04 NOTE — Therapy (Signed)
[facility] [address]  [place], [name], [address] Phone: [phone]   Fax:  [phone]  Physical Therapy Treatment  Patient Details  Name: Autumn Cortez MRN: [id number] Date of Birth: February 25, 2001 Referring Provider (PT): Riki Sheer, DO   Encounter Date: 03/04/2021   PT End of Session - 03/04/21 1726    Visit Number 5    Number of Visits 13    Date for PT Re-Evaluation 03/14/21    Authorization Type UHC Medicaid    PT Start Time 0881    PT Stop Time 1705    PT Time Calculation (min) 49 min    Activity Tolerance Patient tolerated treatment well    Behavior During Therapy [facility] for tasks assessed/performed           Past Medical History:  Diagnosis Date  . ADD (attention deficit disorder with hyperactivity)   . Anxiety   . Asthma   . Bipolar 1 disorder ([place])   . Migraines     Past Surgical History:  Procedure Laterality Date  . ADENOIDECTOMY  8/11  . TONSILLECTOMY      There were no vitals filed for this visit.   Subjective Assessment - 03/04/21 1618    Subjective Pt reports her neck was bothering her bad yesterday at work, says sh got her mom to bring her an ice pack and it helped it.    Pertinent History migraines, bipolar 1, asthma, anxiety, ADD    Diagnostic tests per patient- xrays were done but unable to access    Patient Stated Goals decrease pain    Currently in Pain? Yes    Pain Score 4     Pain Location Neck    Pain Orientation Right;Left;Posterior    Pain Descriptors / Indicators Aching    Pain Type Acute pain                             [facility] Adult PT Treatment/Exercise - 03/04/21 0001      Neck Exercises: Theraband   Rows 10 reps;Red    Rows Limitations cues for scap retract    Other Theraband Exercises extensions R tband 10 reps      Neck Exercises: Seated   Other Seated Exercise rhomboid stretch 3x30 sec      Lumbar Exercises: Aerobic    Nustep L4x85mn      Lumbar Exercises: Seated   Other Seated Lumbar Exercises prayer stretch on physioball 10x10      Lumbar Exercises: Supine   Bridge Compliant;10 reps;Limitations    Bridge Limitations R tband; decreased ROM    Straight Leg Raise 10 reps    Straight Leg Raises Limitations Bilateral      Modalities   Modalities Moist Heat      Moist Heat Therapy   Number Minutes Moist Heat 12 Minutes      Electrical Stimulation   Electrical Stimulation Location B UT and lumbar paraspinals   B UT and lumbar paraspinals   Electrical Stimulation Action IFC    Electrical Stimulation Parameters intensity to tolerance    Electrical Stimulation Goals Tone;Pain                    PT Short Term Goals - 02/20/21 1620      PT SHORT TERM GOAL #1   Title Patient to be independent with initial HEP.    Time 3  Period Weeks    Status Achieved    Target Date 02/21/21             PT Long Term Goals - 02/20/21 1539      PT LONG TERM GOAL #1   Title Patient to be independent with advanced HEP.    Time 6    Period Weeks    Status On-going      PT LONG TERM GOAL #2   Title Patient to demonstrate B UE and LE strength >/=4+/5.    Time 6    Period Weeks    Status On-going      PT LONG TERM GOAL #3   Title Patient to demonstrate cervical AROM WFL and without pain limiting.    Time 6    Period Weeks    Status On-going      PT LONG TERM GOAL #4   Title Patient to demonstrate lumbar AROM WFL and without pain limiting.    Time 6    Period Weeks    Status On-going      PT LONG TERM GOAL #5   Title Patient to report tolerance for standing and walking without limitation to fully return to school and work duties.    Time 6    Period Weeks    Status Partially Met   reporting a couple of hours of standing and walking before increase in pain                Plan - 03/04/21 1727    Clinical Impression Statement Pt noted that her pain was bad yesterday while at  work but she felt better today. Did scap stability for posture and to strengthen periscap muscles for stability of the shoulders and neck. Pt had no complaints during session other than some difficulty with progressed exercises. Cues were given to prevent substitutions and along with keeping good posture and alignment of the spine throughout session. Estim with heat given post session to address pain post session.    Personal Factors and Comorbidities Age;Comorbidity 3+;Fitness;Past/Current Experience;Profession;Time since onset of injury/illness/exacerbation    Comorbidities migraines, bipolar 1, asthma, anxiety, ADD    PT Frequency 2x / week    PT Duration 6 weeks    PT Treatment/Interventions ADLs/Self Care Home Management;Cryotherapy;Electrical Stimulation;Moist Heat;Traction;Balance training;Therapeutic exercise;Therapeutic activities;Functional mobility training;Stair training;Gait training;Ultrasound;Neuromuscular re-education;Patient/family education;Manual techniques;Taping;Energy conservation;Dry needling;Passive range of motion    PT Next Visit Plan progress UE and LE strength and cervical/lumbar mobility    Consulted and Agree with Plan of Care Patient           Patient will benefit from skilled therapeutic intervention in order to improve the following deficits and impairments:  Hypomobility,Pain,Impaired UE functional use,Increased fascial restricitons,Decreased strength,Decreased activity tolerance,Difficulty walking,Increased muscle spasms,Improper body mechanics,Decreased range of motion,Impaired flexibility,Postural dysfunction  Visit Diagnosis: Cervicalgia  Acute bilateral low back pain without sciatica  Muscle weakness (generalized)  Abnormal posture     Problem List Patient Active Problem List   Diagnosis Date Noted  . 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 Suicide ideation 06/27/2019  . ADHD (attention deficit  hyperactivity disorder), inattentive type 06/23/2019  . Overdose 06/23/2019  . Migraine headache 06/23/2019  . MDD (major depressive disorder), recurrent, severe, with psychosis ([place]) 02/22/2018    Artist Pais, PTA 03/04/2021, 5:33 PM  [facility] [address]  [place], [name], [address] Phone: [phone]  Fax:  [phone]  Name: Yulisa Chirico MRN: [id number] Date of Birth: 26-Mar-2001

## 2021-03-07 ENCOUNTER — Other Ambulatory Visit: Payer: Self-pay

## 2021-03-07 ENCOUNTER — Encounter: Payer: Self-pay | Admitting: Physical Therapy

## 2021-03-07 ENCOUNTER — Ambulatory Visit: Payer: BC Managed Care – PPO | Admitting: Physical Therapy

## 2021-03-07 DIAGNOSIS — M542 Cervicalgia: Secondary | ICD-10-CM

## 2021-03-07 DIAGNOSIS — R262 Difficulty in walking, not elsewhere classified: Secondary | ICD-10-CM

## 2021-03-07 DIAGNOSIS — R293 Abnormal posture: Secondary | ICD-10-CM

## 2021-03-07 DIAGNOSIS — M545 Low back pain, unspecified: Secondary | ICD-10-CM

## 2021-03-07 DIAGNOSIS — M6281 Muscle weakness (generalized): Secondary | ICD-10-CM

## 2021-03-07 NOTE — Therapy (Signed)
[facility] [address]  [place], [name], [address] Phone: [phone]   Fax:  [phone]  Physical Therapy Treatment  Patient Details  Name: Autumn Cortez MRN: [id number] Date of Birth: 2001/11/07 Referring Provider (PT): Riki Sheer, [place]   Encounter Date: 03/07/2021   PT End of Session - 03/07/21 1610    Visit Number 6    Number of Visits 13    Date for PT Re-Evaluation 03/14/21    Authorization Type UHC Medicaid    PT Start Time 9166   pt late   PT Stop Time 1615    PT Time Calculation (min) 27 min    Activity Tolerance Patient tolerated treatment well    Behavior During Therapy [facility] for tasks assessed/performed           Past Medical History:  Diagnosis Date  . ADD (attention deficit disorder with hyperactivity)   . Anxiety   . Asthma   . Bipolar 1 disorder ([place])   . Migraines     Past Surgical History:  Procedure Laterality Date  . ADENOIDECTOMY  8/11  . TONSILLECTOMY      There were no vitals filed for this visit.   Subjective Assessment - 03/07/21 1550    Subjective "I am actually not in noticeable pain today. I think I am progressing slowly but surely." Interested in trying DN.    Pertinent History migraines, bipolar 1, asthma, anxiety, ADD    Diagnostic tests per patient- xrays were done but unable to access    Patient Stated Goals decrease pain    Currently in Pain? No/denies                             [facility] Adult PT Treatment/Exercise - 03/07/21 0001      Neck Exercises: Machines for Strengthening   UBE (Upper Arm Bike) L1.5 x 2 min forward, 2 min back      Neck Exercises: Standing   Other Standing Exercises red TB shoulder extension 2x10   more difficiulty; still showing rounded posture   Other Standing Exercises red TB row 2x10      Neck Exercises: Seated   Cervical Rotation Right;Left;10 reps    Cervical Rotation Limitations SNAG to  tolerance    Other Seated Exercise cervical extension SNAG to tolerance 10x                    PT Short Term Goals - 02/20/21 1620      PT SHORT TERM GOAL #1   Title Patient to be independent with initial HEP.    Time 3    Period Weeks    Status Achieved    Target Date 02/21/21             PT Long Term Goals - 02/20/21 1539      PT LONG TERM GOAL #1   Title Patient to be independent with advanced HEP.    Time 6    Period Weeks    Status On-going      PT LONG TERM GOAL #2   Title Patient to demonstrate B UE and LE strength >/=4+/5.    Time 6    Period Weeks    Status On-going      PT LONG TERM GOAL #3   Title Patient to demonstrate cervical AROM WFL and without pain limiting.    Time 6  Period Weeks    Status On-going      PT LONG TERM GOAL #4   Title Patient to demonstrate lumbar AROM WFL and without pain limiting.    Time 6    Period Weeks    Status On-going      PT LONG TERM GOAL #5   Title Patient to report tolerance for standing and walking without limitation to fully return to school and work duties.    Time 6    Period Weeks    Status Partially Met   reporting a couple of hours of standing and walking before increase in pain                Plan - 03/07/21 1613    Clinical Impression Statement Patient arrived to session with report of improved pain levels. Notes that she is progressing "slowly but surely" and is interested in DN. Worked on cervical ROM SNAGs with patient initially demonstrating increased cervical lordosis and shoulder elevation. Educated patient on importance of maintaining upright neutral posture, especially when performing ROM to avoid irritation.  Periscapular strengthening we performed with cueing to encourage scapular depression with good effort to correct. Patient reporting no complaints at end of session. Patient progressing well towards goals.    Personal Factors and Comorbidities Age;Comorbidity  3+;Fitness;Past/Current Experience;Profession;Time since onset of injury/illness/exacerbation    Comorbidities migraines, bipolar 1, asthma, anxiety, ADD    PT Frequency 2x / week    PT Duration 6 weeks    PT Treatment/Interventions ADLs/Self Care Home Management;Cryotherapy;Electrical Stimulation;Moist Heat;Traction;Balance training;Therapeutic exercise;Therapeutic activities;Functional mobility training;Stair training;Gait training;Ultrasound;Neuromuscular re-education;Patient/family education;Manual techniques;Taping;Energy conservation;Dry needling;Passive range of motion    PT Next Visit Plan progress UE and LE strength and cervical/lumbar mobility    Consulted and Agree with Plan of Care Patient           Patient will benefit from skilled therapeutic intervention in order to improve the following deficits and impairments:  Hypomobility,Pain,Impaired UE functional use,Increased fascial restricitons,Decreased strength,Decreased activity tolerance,Difficulty walking,Increased muscle spasms,Improper body mechanics,Decreased range of motion,Impaired flexibility,Postural dysfunction  Visit Diagnosis: Cervicalgia  Acute bilateral low back pain without sciatica  Muscle weakness (generalized)  Abnormal posture  Difficulty in walking, not elsewhere classified     Problem List Patient Active Problem List   Diagnosis Date Noted  . 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 Suicide ideation 06/27/2019  . ADHD (attention deficit hyperactivity disorder), inattentive type 06/23/2019  . Overdose 06/23/2019  . Migraine headache 06/23/2019  . MDD (major depressive disorder), recurrent, severe, with psychosis ([place]) 02/22/2018     Janene Harvey, PT, DPT 03/07/21 4:16 PM   [facility] [address]  [place], [name], [address] Phone: [phone]   Fax:  [phone]  Name:  Autumn Cortez MRN: [id number] Date of Birth: [birth year]

## 2021-03-11 ENCOUNTER — Other Ambulatory Visit: Payer: Self-pay

## 2021-03-11 ENCOUNTER — Ambulatory Visit: Payer: BC Managed Care – PPO | Admitting: Physical Therapy

## 2021-03-11 ENCOUNTER — Encounter: Payer: Self-pay | Admitting: Physical Therapy

## 2021-03-11 DIAGNOSIS — M545 Low back pain, unspecified: Secondary | ICD-10-CM

## 2021-03-11 DIAGNOSIS — M542 Cervicalgia: Secondary | ICD-10-CM

## 2021-03-11 DIAGNOSIS — M6281 Muscle weakness (generalized): Secondary | ICD-10-CM

## 2021-03-11 DIAGNOSIS — R293 Abnormal posture: Secondary | ICD-10-CM

## 2021-03-11 DIAGNOSIS — R262 Difficulty in walking, not elsewhere classified: Secondary | ICD-10-CM

## 2021-03-11 NOTE — Therapy (Signed)
[facility] [address]  [place], [name], [address] Phone: [phone]   Fax:  [phone]  Physical Therapy Treatment  Patient Details  Name: Autumn Cortez MRN: [id number] Date of Birth: 01/24/2001 Referring Provider (PT): Riki Sheer, [place]    Encounter Date: 03/11/2021   PT End of Session - 03/11/21 1615    Visit Number 7    Number of Visits 13    Date for PT Re-Evaluation 03/14/21    Authorization Type UHC Medicaid    PT Start Time 1540   pt late   PT Stop Time 1614    PT Time Calculation (min) 34 min    Activity Tolerance Patient tolerated treatment well    Behavior During Therapy [facility] for tasks assessed/performed           Past Medical History:  Diagnosis Date  . ADD (attention deficit disorder with hyperactivity)   . Anxiety   . Asthma   . Bipolar 1 disorder ([place])   . Migraines     Past Surgical History:  Procedure Laterality Date  . ADENOIDECTOMY  8/11  . TONSILLECTOMY      There were no vitals filed for this visit.   Subjective Assessment - 03/11/21 1541    Subjective Having pain today in the L shoulder blade and down the mid back.    Pertinent History migraines, bipolar 1, asthma, anxiety, ADD    Diagnostic tests per patient- xrays were done but unable to access    Patient Stated Goals decrease pain    Currently in Pain? Yes    Pain Score 6    Pain Location Scapula    Pain Orientation Left    Pain Descriptors / Indicators Sharp    Pain Type Acute pain                             [facility] Adult PT Treatment/Exercise - 03/11/21 0001      Neck Exercises: Machines for Strengthening   UBE (Upper Arm Bike) 2.0 x 3 min forward, 3 min back      Neck Exercises: Seated   Neck Retraction 10 reps;3 secs    Neck Retraction Limitations good form    Other Seated Exercise B shoulder horizontal abduction 2x10 with red TB, B shoulder ER with yellow TB + pool noodle  along spine   good form; cues to depress shoulders     Lumbar Exercises: Standing   Other Standing Lumbar Exercises thoracic extension at wall 10x   cues to maintain neutral lumbar spine     Lumbar Exercises: Sidelying   Other Sidelying Lumbar Exercises R/L open book stretch 10x each to tolerance   cues to achieve end range     Manual Therapy   Manual Therapy Soft tissue mobilization;Myofascial release;Joint mobilization    Manual therapy comments prone    Soft tissue mobilization STM to L rhomboids and thoracic paraspinals    Myofascial Release manual TPR to L rhomboids and thoracic paraspinals   taut band of muscle & trigger pts evident                 PT Education - 03/11/21 1615    Education Details update to HEP    Person(s) Educated Patient    Methods Explanation;Demonstration;Tactile cues;Verbal cues;Handout    Comprehension Verbalized understanding;Returned demonstration            PT Short Term  Goals - 02/20/21 1620      PT SHORT TERM GOAL #1   Title Patient to be independent with initial HEP.    Time 3    Period Weeks    Status Achieved    Target Date 02/21/21             PT Long Term Goals - 02/20/21 1539      PT LONG TERM GOAL #1   Title Patient to be independent with advanced HEP.    Time 6    Period Weeks    Status On-going      PT LONG TERM GOAL #2   Title Patient to demonstrate B UE and LE strength >/=4+/5.    Time 6    Period Weeks    Status On-going      PT LONG TERM GOAL #3   Title Patient to demonstrate cervical AROM WFL and without pain limiting.    Time 6    Period Weeks    Status On-going      PT LONG TERM GOAL #4   Title Patient to demonstrate lumbar AROM WFL and without pain limiting.    Time 6    Period Weeks    Status On-going      PT LONG TERM GOAL #5   Title Patient to report tolerance for standing and walking without limitation to fully return to school and work duties.    Time 6    Period Weeks    Status  Partially Met   reporting a couple of hours of standing and walking before increase in pain                Plan - 03/11/21 1615    Clinical Impression Statement Patient reporting pain in the L periscapular area and down the L thoracic spine today. Worked on gentle cervical and periscapular strengthening with patient reporting good tolerance. Patient also able to demonstrate good form for the most part with today's ther-ex. Reported no change in pain after strengthening, thus proceeded with STM and TPR to L rhomboids and thoracic paraspinals. Patient with severable palpable trigger pts and taut muscle bands throughout. Patient reported improvement in pain levels after MT, thus modalities deferred.    Personal Factors and Comorbidities Age;Comorbidity 3+;Fitness;Past/Current Experience;Profession;Time since onset of injury/illness/exacerbation    Comorbidities migraines, bipolar 1, asthma, anxiety, ADD    PT Frequency 2x / week    PT Duration 6 weeks    PT Treatment/Interventions ADLs/Self Care Home Management;Cryotherapy;Electrical Stimulation;Moist Heat;Traction;Balance training;Therapeutic exercise;Therapeutic activities;Functional mobility training;Stair training;Gait training;Ultrasound;Neuromuscular re-education;Patient/family education;Manual techniques;Taping;Energy conservation;Dry needling;Passive range of motion    PT Next Visit Plan progress UE and LE strength and cervical/lumbar mobility    Consulted and Agree with Plan of Care Patient           Patient will benefit from skilled therapeutic intervention in order to improve the following deficits and impairments:  Hypomobility,Pain,Impaired UE functional use,Increased fascial restricitons,Decreased strength,Decreased activity tolerance,Difficulty walking,Increased muscle spasms,Improper body mechanics,Decreased range of motion,Impaired flexibility,Postural dysfunction  Visit Diagnosis: Cervicalgia  Acute bilateral low back  pain without sciatica  Muscle weakness (generalized)  Abnormal posture  Difficulty in walking, not elsewhere classified     Problem List Patient Active Problem List   Diagnosis Date Noted  . 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 Suicide ideation 06/27/2019  . ADHD (attention deficit hyperactivity disorder), inattentive type 06/23/2019  . Overdose 06/23/2019  . Migraine headache 06/23/2019  .  MDD (major depressive disorder), recurrent, severe, with psychosis ([place]) 02/22/2018     Janene Harvey, PT, DPT 03/11/21 4:17 PM   [facility] [address]  [place], [name], [address] Phone: [phone]   Fax:  [phone]  Name: Autumn Cortez MRN: [id number] Date of Birth: 2001/06/08

## 2021-03-12 ENCOUNTER — Ambulatory Visit (INDEPENDENT_AMBULATORY_CARE_PROVIDER_SITE_OTHER): Payer: BC Managed Care – PPO | Admitting: Family Medicine

## 2021-03-12 ENCOUNTER — Encounter: Payer: Self-pay | Admitting: Family Medicine

## 2021-03-12 ENCOUNTER — Other Ambulatory Visit: Payer: Self-pay

## 2021-03-12 VITALS — BP 110/64 | HR 80 | Temp 98.1°F | Resp 16 | Ht 62.0 in | Wt 111.0 lb

## 2021-03-12 DIAGNOSIS — Z0000 Encounter for general adult medical examination without abnormal findings: Secondary | ICD-10-CM | POA: Diagnosis not present

## 2021-03-12 DIAGNOSIS — F333 Major depressive disorder, recurrent, severe with psychotic symptoms: Secondary | ICD-10-CM | POA: Diagnosis not present

## 2021-03-12 DIAGNOSIS — Z23 Encounter for immunization: Secondary | ICD-10-CM

## 2021-03-12 LAB — LIPID PANEL
Cholesterol: 162 mg/dL (ref 0–200)
HDL: 61.3 mg/dL (ref >39.00)
LDL Cholesterol: 68 mg/dL (ref 0–99)
NonHDL: 100.71
Total CHOL/HDL Ratio: 3
Triglycerides: 165 mg/dL — ABNORMAL HIGH (ref 0.0–149.0)
VLDL: 33 mg/dL (ref 0.0–40.0)

## 2021-03-12 LAB — COMPREHENSIVE METABOLIC PANEL
ALT: 11 U/L (ref 0–35)
AST: 10 U/L (ref 0–37)
Albumin: 3.9 g/dL (ref 3.5–5.2)
Alkaline Phosphatase: 64 U/L (ref 47–119)
BUN: 12 mg/dL (ref 6–23)
CO2: 25 mEq/L (ref 19–32)
Calcium: 9.6 mg/dL (ref 8.4–10.5)
Chloride: 106 mEq/L (ref 96–112)
Creatinine, Ser: 0.92 mg/dL (ref 0.40–1.20)
GFR: 90.73 mL/min (ref >60.00)
Glucose, Bld: 70 mg/dL (ref 70–99)
Potassium: 3.6 mEq/L (ref 3.5–5.1)
Sodium: 141 mEq/L (ref 135–145)
Total Bilirubin: 0.6 mg/dL (ref 0.3–1.2)
Total Protein: 6.4 g/dL (ref 6.0–8.3)

## 2021-03-12 LAB — T4, FREE: Free T4: 0.87 ng/dL (ref 0.60–1.60)

## 2021-03-12 LAB — CBC
HCT: 40.1 % (ref 36.0–49.0)
Hemoglobin: 14 g/dL (ref 12.0–16.0)
MCHC: 34.9 g/dL (ref 31.0–37.0)
MCV: 86.9 fl (ref 78.0–98.0)
Platelets: 287 10*3/uL (ref 150.0–575.0)
RBC: 4.62 Mil/uL (ref 3.80–5.70)
RDW: 12.2 % (ref 11.4–15.5)
WBC: 7.9 10*3/uL (ref 4.5–13.5)

## 2021-03-12 LAB — TSH: TSH: 2.49 u[IU]/mL (ref 0.40–5.00)

## 2021-03-12 MED ORDER — BUDESONIDE 180 MCG/ACT IN AEPB: 1.0000 | INHALATION_SPRAY | Freq: Two times a day (BID) | RESPIRATORY_TRACT | 2 refills | Status: DC

## 2021-03-12 MED ORDER — ALBUTEROL SULFATE HFA 108 (90 BASE) MCG/ACT IN AERS: 2.0000 | INHALATION_SPRAY | Freq: Four times a day (QID) | RESPIRATORY_TRACT | 3 refills | Status: DC | PRN

## 2021-03-12 MED ORDER — TROKENDI XR 25 MG PO CP24: 1.0000 | ORAL_CAPSULE | Freq: Every morning | ORAL | 2 refills | Status: DC

## 2021-03-12 NOTE — Progress Notes (Signed)
Chief Complaint  Patient presents with  . Annual Exam     Well Woman Autumn Cortez is here for a complete physical.   Her last physical was >1 year ago.  Current diet: in general, a "healthy" diet. Current exercise: none. Contraception? Yes Patient's last menstrual period was 02/24/2021. Fatigue out of ordinary? No Seatbelt? Yes Loss of interested in doing things or depression in past 2 weeks? No   Health Maintenance  Tetanus- Yes HIV screening- Yes STI screening- Yes  Past Medical History:  Diagnosis Date  . ADD (attention deficit disorder with hyperactivity)   . Anxiety   . Asthma   . Bipolar 1 disorder ([facility])   . Migraines      Past Surgical History:  Procedure Laterality Date  . ADENOIDECTOMY  8/11  . TONSILLECTOMY      Medications  Current Outpatient Medications on File Prior to Visit  Medication Sig Dispense Refill  . amphetamine-dextroamphetamine (ADDERALL XR) 20 MG 24 hr capsule Take 20 mg by mouth daily.     . cyclobenzaprine (FLEXERIL) 10 MG tablet Take 10 mg by mouth 3 (three) times daily as needed for muscle spasms.    . drospirenone-ethinyl estradiol (YAZ) 3-0.02 MG tablet Take 1 tablet by mouth daily. 84 tablet 3  . fluticasone (FLONASE) 50 MCG/ACT nasal spray Place 2 sprays into both nostrils daily. 16 g 1  . lamoTRIgine (LAMICTAL) 100 MG tablet Take 100 mg by mouth 2 (two) times daily.    . meloxicam (MOBIC) 15 MG tablet Take 1 tablet (15 mg total) by mouth daily. 30 tablet 0  . QUEtiapine (SEROQUEL) 200 MG tablet Take 1 tablet (200 mg total) by mouth at bedtime as needed (sleep). 30 tablet 0   No current facility-administered medications on file prior to visit.     Allergies Allergies  Allergen Reactions  . Pollen Extract Other (See Comments)    Seasonal allergies - runny nose, sneezing, watery eyes Congestion and sneezing    Review of Systems: Constitutional:  no unexpected weight changes Eye:  no recent significant change in  vision Ear/Nose/Mouth/Throat:  Ears:  no tinnitus or vertigo and no recent change in hearing Nose/Mouth/Throat:  no complaints of nasal congestion, no sore throat Cardiovascular: no chest pain Respiratory:  no cough and no shortness of breath Gastrointestinal:  no abdominal pain, no change in bowel habits GU:  Female: negative for dysuria, +pelvic pain Musculoskeletal/Extremities:  no pain of the joints Integumentary (Skin/Breast):  no abnormal skin lesions reported Neurologic:  no headaches Endocrine:  denies fatigue Hematologic/Lymphatic:  No areas of easy bleeding  Exam BP 110/64 (BP Location: Left Arm, Patient Position: Sitting, Cuff Size: Normal)   Pulse 80   Temp 98.1 F (36.7 C)   Resp 16   Ht 5\' 2"  (1.575 m)   Wt 111 lb (50.3 kg)   LMP 02/24/2021   SpO2 98%   BMI 20.30 kg/m  General:  well developed, well nourished, in no apparent distress Skin:  no significant moles, warts, or growths Head:  no masses, lesions, or tenderness Eyes:  pupils equal and round, sclera anicteric without injection Ears:  canals without lesions, TMs shiny without retraction, no obvious effusion, no erythema Nose:  nares patent, septum midline, mucosa normal, and no drainage or sinus tenderness Throat/Pharynx:  lips and gingiva without lesion; tongue and uvula midline; non-inflamed pharynx; no exudates or postnasal drainage Neck: neck supple without adenopathy, thyromegaly, or masses Lungs:  clear to auscultation, breath sounds equal bilaterally, no  respiratory distress Cardio:  regular rate and rhythm, no bruits, no LE edema Abdomen:  abdomen soft, nontender; bowel sounds normal; no masses or organomegaly Genital: Defer to GYN Musculoskeletal:  symmetrical muscle groups noted without atrophy or deformity Extremities:  no clubbing, cyanosis, or edema, no deformities, no skin discoloration Neuro:  gait normal; deep tendon reflexes normal and symmetric Psych: well oriented with normal range of  affect and appropriate judgment/insight  Assessment and Plan  Well adult exam - Plan: Comprehensive metabolic panel, Lipid panel, CBC, TSH, T4, free, T3  MDD (major depressive disorder), recurrent, severe, with psychosis (HCC), Chronic  Immunization due - Plan: Meningococcal B, OMV  Well 20 y.o. female. Counseled on diet and exercise. Other orders as above. Rec covid vaccine. Pt in precontemplative stage.  Follow up in 6 mo for med ck or prn. The patient voiced understanding and agreement to the plan.  Jilda Roche [place], DO 03/12/21 8:23 AM

## 2021-03-12 NOTE — Patient Instructions (Addendum)
Call Center for [facility] at [facility] at [phone] for an appointment.  They are located at [address], [address], [place], [place], [address] (right across the hall from our office).  Give [place] 2-3 business days to get the results of your labs back.   Keep the diet clean and stay active.  Aim to do some physical exertion for 150 minutes per week. This is typically divided into 5 days per week, 30 minutes per day. The activity should be enough to get your heart rate up. Anything is better than nothing if you have time constraints.  Let us know if you need anything.

## 2021-03-13 LAB — T3: T3, Total: 129 ng/dL (ref 86–192)

## 2021-03-14 ENCOUNTER — Encounter: Payer: Self-pay | Admitting: Physical Therapy

## 2021-03-14 ENCOUNTER — Other Ambulatory Visit: Payer: Self-pay

## 2021-03-14 ENCOUNTER — Ambulatory Visit: Payer: BC Managed Care – PPO | Admitting: Physical Therapy

## 2021-03-14 DIAGNOSIS — M545 Low back pain, unspecified: Secondary | ICD-10-CM

## 2021-03-14 DIAGNOSIS — R293 Abnormal posture: Secondary | ICD-10-CM

## 2021-03-14 DIAGNOSIS — M6281 Muscle weakness (generalized): Secondary | ICD-10-CM

## 2021-03-14 DIAGNOSIS — M542 Cervicalgia: Secondary | ICD-10-CM | POA: Diagnosis not present

## 2021-03-14 DIAGNOSIS — R262 Difficulty in walking, not elsewhere classified: Secondary | ICD-10-CM

## 2021-03-14 NOTE — Therapy (Signed)
[facility] [address]  [place], [name], [address] Phone: [phone]   Fax:  [phone]  Physical Therapy Progress Note  Patient Details  Name: Autumn Cortez MRN: [id number] Date of Birth: 05-03-2001 Referring Provider (PT): Riki Sheer, DO   Encounter Date: 03/14/2021   PT End of Session - 03/14/21 1612    Visit Number 8    Number of Visits 20    Date for PT Re-Evaluation 04/25/21    Authorization Type [employer] Medicaid    Authorization - Visit Number 8    Authorization - Number of Visits 27    PT Start Time 8309    PT Stop Time 1612    PT Time Calculation (min) 41 min    Activity Tolerance Patient tolerated treatment well    Behavior During Therapy [facility] for tasks assessed/performed           Past Medical History:  Diagnosis Date  . ADD (attention deficit disorder with hyperactivity)   . Anxiety   . Asthma   . Bipolar 1 disorder ([place])   . Migraines     Past Surgical History:  Procedure Laterality Date  . ADENOIDECTOMY  8/11  . TONSILLECTOMY      There were no vitals filed for this visit.   Subjective Assessment - 03/14/21 1534    Subjective Has been doing a little better. Her personal TENS unit came in the mail. Denies questions on its use. Overall reports 65% improvement. Still feels limited at work because she has to stand constantly.    Pertinent History migraines, bipolar 1, asthma, anxiety, ADD    Diagnostic tests per patient- xrays were done but unable to access    Patient Stated Goals decrease pain    Currently in Pain? No/denies              [facility] PT Assessment - 03/14/21 0001      Assessment   Medical Diagnosis Neck pain, B LBP without sciatica    Referring Provider (PT) Riki Sheer, DO    Onset Date/Surgical Date 01/08/21      AROM   Cervical Flexion 45    Cervical Extension 47    Cervical - Right Side Bend 49    Cervical - Left Side Bend 47    Cervical  - Right Rotation 66    Cervical - Left Rotation 76    Lumbar Flexion toes    Lumbar Extension WNL    Lumbar - Right Side Bend proximal tibia   mild pain in LB   Lumbar - Left Side Bend proximal tibia   mild pain in LB   Lumbar - Right Rotation WNL    Lumbar - Left Rotation WNL      Strength   Right Shoulder Flexion 4+/5    Right Shoulder ABduction 4+/5    Right Shoulder Internal Rotation 4+/5    Right Shoulder External Rotation 4+/5    Left Shoulder Flexion 4/5   pt reports that she recently got a shot in the L arm   Left Shoulder ABduction 4+/5    Left Shoulder Internal Rotation 4+/5    Left Shoulder External Rotation 4+/5    Right Elbow Flexion 4+/5    Right Elbow Extension 4+/5    Left Elbow Flexion 4+/5    Left Elbow Extension 4+/5    Right Wrist Flexion 4+/5    Right Wrist Extension 4+/5    Left Wrist Flexion 4+/5  Left Wrist Extension 4+/5    Right Hip Flexion 4+/5    Right Hip ABduction 4+/5    Right Hip ADduction 4/5    Left Hip Flexion 4+/5    Left Hip ABduction 4+/5    Left Hip ADduction 4+/5    Right Knee Flexion 4/5    Right Knee Extension 4+/5    Left Knee Flexion 4+/5    Left Knee Extension 4/5    Right Ankle Dorsiflexion 4+/5    Right Ankle Plantar Flexion 4+/5    Left Ankle Dorsiflexion 4+/5    Left Ankle Plantar Flexion 4+/5      Palpation   Palpation comment increased soft tissue restriction in B UT, scalenes, pec, cervical paraspinals and suboccipitals; rhomboids, thoracic paraspinals                         [facility] Adult PT Treatment/Exercise - 03/14/21 0001      Lumbar Exercises: Aerobic   Nustep L4x22mn                  PT Education - 03/14/21 1612    Education Details edu on post-DN instructions and expectations; edu on objective progress thus far    Person(s) Educated Patient    Methods Explanation    Comprehension Verbalized understanding            PT Short Term Goals - 03/14/21 1750      PT SHORT TERM  GOAL #1   Title Patient to be independent with initial HEP.    Time 3    Period Weeks    Status Achieved    Target Date 02/21/21             PT Long Term Goals - 03/14/21 1750      PT LONG TERM GOAL #1   Title Patient to be independent with advanced HEP.    Time 6    Period Weeks    Status Partially Met   met for current   Target Date 04/25/21      PT LONG TERM GOAL #2   Title Patient to demonstrate B UE and LE strength >/=4+/5.    Time 6    Period Weeks    Status Partially Met   much improved; L shoulder flexion, R hip adduction, R knee flexion, and L knee extension strength limiting   Target Date 04/25/21      PT LONG TERM GOAL #3   Title Patient to demonstrate cervical AROM WFL and without pain limiting.    Time 6    Period Weeks    Status Partially Met   improved in all planes with exception of extension   Target Date 04/25/21      PT LONG TERM GOAL #4   Title Patient to demonstrate lumbar AROM WFL and without pain limiting.    Time 6    Period Weeks    Status Partially Met   good improvement in pain levels   Target Date 04/25/21      PT LONG TERM GOAL #5   Title Patient to report tolerance for standing and walking without limitation to fully return to school and work duties.    Time 6    Period Weeks    Status Partially Met   limited to 3 hours on her feet at work   Target Date 04/25/21  Plan - 03/14/21 1613    Clinical Impression Statement Patient reporting improvement in pain levels today and overall reports 65% improvement, with most limitation still remaining in standing tolerance at work. Reports that she is now able to tolerate  a shift (3 hours) of standing at work before the onset of increased pain in the neck and shoulders. UE strength testing revealed improvement in R shoulder flexion, L elbow extension, R wrist extension; now with L shoulder flexion only limiting. LE strength testing revealed improvement in B hip flexion, B  abduction, L adduction, R knee extension, L knee flexion, and B DF and PF; now only limiting in R hip adduction, R knee flexion, and L knee extension strength. Cervical AROM has improved in all planes with exception of extension. Lumbar AROM is WNL and with good improvement in pain levels. The following muscle groups were identified to have increased soft tissue restriction today and would benefit from DN: increased soft tissue restriction in B UT, scalenes, pec, cervical paraspinals and suboccipitals; rhomboids, thoracic paraspinals. Patient was educated on objective progress thus far and encouraged to continue compliance with HEP. Patient has demonstrated good progress towards goals. Would benefit from continued skilled PT services 2x/week for 6 weeks to address remaining impairments and return to PLOF.    Personal Factors and Comorbidities Age;Comorbidity 3+;Fitness;Past/Current Experience;Profession;Time since onset of injury/illness/exacerbation    Comorbidities migraines, bipolar 1, asthma, anxiety, ADD    PT Frequency 2x / week    PT Duration 6 weeks    PT Treatment/Interventions ADLs/Self Care Home Management;Cryotherapy;Electrical Stimulation;Moist Heat;Traction;Balance training;Therapeutic exercise;Therapeutic activities;Functional mobility training;Stair training;Gait training;Ultrasound;Neuromuscular re-education;Patient/family education;Manual techniques;Taping;Energy conservation;Dry needling;Passive range of motion    PT Next Visit Plan DN; progress UE and LE strength and cervical/lumbar mobility    Consulted and Agree with Plan of Care Patient           Patient will benefit from skilled therapeutic intervention in order to improve the following deficits and impairments:  Hypomobility,Pain,Impaired UE functional use,Increased fascial restricitons,Decreased strength,Decreased activity tolerance,Difficulty walking,Increased muscle spasms,Improper body mechanics,Decreased range of  motion,Impaired flexibility,Postural dysfunction  Visit Diagnosis: Cervicalgia  Acute bilateral low back pain without sciatica  Muscle weakness (generalized)  Abnormal posture  Difficulty in walking, not elsewhere classified     Problem List Patient Active Problem List   Diagnosis Date Noted  . 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 Suicide ideation 06/27/2019  . ADHD (attention deficit hyperactivity disorder), inattentive type 06/23/2019  . Overdose 06/23/2019  . Migraine headache 06/23/2019  . MDD (major depressive disorder), recurrent, severe, with psychosis ([place]) 02/22/2018     Janene Harvey, PT, DPT 03/14/21 5:55 PM   [facility] [address]  [place], [name], [address] Phone: [phone]   Fax:  [phone]  Name: Autumn Cortez MRN: [id number] Date of Birth: [birth year]

## 2021-03-18 ENCOUNTER — Ambulatory Visit: Payer: BC Managed Care – PPO | Admitting: Physical Therapy

## 2021-03-18 ENCOUNTER — Encounter: Payer: Self-pay | Admitting: Physical Therapy

## 2021-03-18 ENCOUNTER — Other Ambulatory Visit: Payer: Self-pay

## 2021-03-18 DIAGNOSIS — M6281 Muscle weakness (generalized): Secondary | ICD-10-CM

## 2021-03-18 DIAGNOSIS — M542 Cervicalgia: Secondary | ICD-10-CM

## 2021-03-18 DIAGNOSIS — R293 Abnormal posture: Secondary | ICD-10-CM

## 2021-03-18 DIAGNOSIS — M545 Low back pain, unspecified: Secondary | ICD-10-CM

## 2021-03-18 DIAGNOSIS — R262 Difficulty in walking, not elsewhere classified: Secondary | ICD-10-CM

## 2021-03-18 NOTE — Therapy (Signed)
[facility] [address] [facility] [address]  [place], [name], [address] Phone: [phone]   Fax:  [phone]  Physical Therapy Treatment  Patient Details  Name: Autumn Cortez MRN: [id number] Date of Birth: [birth year] Referring Provider (PT): Riki Sheer, DO   Encounter Date: 03/18/2021   PT End of Session - 03/18/21 1622    Visit Number 9    Number of Visits 20    Date for PT Re-Evaluation 04/25/21    Authorization Type U[employer] Medicaid    Authorization - Visit Number 9    Authorization - Number of Visits 27    PT Start Time 1622    PT Stop Time 1701    PT Time Calculation (min) 39 min    Activity Tolerance Patient tolerated treatment well    Behavior During Therapy [facility] for tasks assessed/performed           Past Medical History:  Diagnosis Date  . ADD (attention deficit disorder with hyperactivity)   . Anxiety   . Asthma   . Bipolar 1 disorder ([place])   . Migraines     Past Surgical History:  Procedure Laterality Date  . ADENOIDECTOMY  8/11  . TONSILLECTOMY      There were no vitals filed for this visit.   Subjective Assessment - 03/18/21 1623    Subjective Pt reports some days are worse than others but today has been a pretty good day - she denies pain currently. She is still interested in trying the DN.    Pertinent History migraines, bipolar 1, asthma, anxiety, ADD    Diagnostic tests per patient- xrays were done but unable to access    Patient Stated Goals decrease pain    Currently in Pain? No/denies                             [facility] Adult PT Treatment/Exercise - 03/18/21 1622      Neck Exercises: Machines for Strengthening   UBE (Upper Arm Bike) 2.0 x 3 min forward, 3 min back      Manual Therapy   Manual Therapy Soft tissue mobilization;Myofascial release;Passive ROM    Manual therapy comments skilled palpation and monitoring during DN    Soft tissue mobilization STM to B  UT, LS & pecs    Myofascial Release STM to B UT, LS & R pecs - decreased ttp and muscle tension following DN    Passive ROM gentle manual UT & LS stretches; gentle overpressure in scap retraction & depression            Trigger Point Dry Needling - 03/18/21 1622    Consent Given? Yes    Education Handout Provided Previously provided    Muscles Treated Head and Neck Upper trapezius;Levator scapulae   bilateral   Muscles Treated Upper Quadrant Pectoralis major   Rt   Upper Trapezius Response Twitch reponse elicited;Palpable increased muscle length    Levator Scapulae Response Twitch response elicited;Palpable increased muscle length    Pectoralis Major Response Twitch response elicited;Palpable increased muscle length                PT Education - 03/18/21 1631    Education Details DN rational, procedures, outcomes, potential side effects, and recommended post-treatment exercises/activity    Person(s) Educated Patient    Methods Explanation   handout previously provided   Comprehension Verbalized understanding  PT Short Term Goals - 03/14/21 1750      PT SHORT TERM GOAL #1   Title Patient to be independent with initial HEP.    Time 3    Period Weeks    Status Achieved    Target Date 02/21/21             PT Long Term Goals - 03/14/21 1750      PT LONG TERM GOAL #1   Title Patient to be independent with advanced HEP.    Time 6    Period Weeks    Status Partially Met   met for current   Target Date 04/25/21      PT LONG TERM GOAL #2   Title Patient to demonstrate B UE and LE strength >/=4+/5.    Time 6    Period Weeks    Status Partially Met   much improved; L shoulder flexion, R hip adduction, R knee flexion, and L knee extension strength limiting   Target Date 04/25/21      PT LONG TERM GOAL #3   Title Patient to demonstrate cervical AROM WFL and without pain limiting.    Time 6    Period Weeks    Status Partially Met   improved in all  planes with exception of extension   Target Date 04/25/21      PT LONG TERM GOAL #4   Title Patient to demonstrate lumbar AROM WFL and without pain limiting.    Time 6    Period Weeks    Status Partially Met   good improvement in pain levels   Target Date 04/25/21      PT LONG TERM GOAL #5   Title Patient to report tolerance for standing and walking without limitation to fully return to school and work duties.    Time 6    Period Weeks    Status Partially Met   limited to 3 hours on her feet at work   Target Date 04/25/21                 Plan - 03/18/21 1628    Clinical Impression Statement [name] reports today is a good day with no pain but she is still interested in trying DN. Increased muscle tension and ttp most prominent in B UT & LS along with R pecs today, all of which appeared amenable to DN. After explanation of DN rational, procedures, outcomes and potential side effects, patient verbalized consent to DN treatment in conjunction with manual STM/DTM and TPR to reduce ttp/muscle tension. Muscles treated include B UT, LS and R pec major. DN produced normal response with good twitches elicited resulting in palpable reduction in pain/ttp and muscle tension. Pt educated to expect mild to moderate muscle soreness for up to 24-48 hrs and instructed to continue prescribed home exercise program and current activity level with pt verbalizing understanding of theses instructions.    Personal Factors and Comorbidities Age;Comorbidity 3+;Fitness;Past/Current Experience;Profession;Time since onset of injury/illness/exacerbation    Comorbidities migraines, bipolar 1, asthma, anxiety, ADD    PT Frequency 2x / week    PT Duration 6 weeks    PT Treatment/Interventions ADLs/Self Care Home Management;Cryotherapy;Electrical Stimulation;Moist Heat;Traction;Balance training;Therapeutic exercise;Therapeutic activities;Functional mobility training;Stair training;Gait  training;Ultrasound;Neuromuscular re-education;Patient/family education;Manual techniques;Taping;Energy conservation;Dry needling;Passive range of motion    PT Next Visit Plan assess response to DN; progress UE and LE strength and cervical/lumbar mobility    Consulted and Agree with Plan of Care Patient  Patient will benefit from skilled therapeutic intervention in order to improve the following deficits and impairments:  Hypomobility,Pain,Impaired UE functional use,Increased fascial restricitons,Decreased strength,Decreased activity tolerance,Difficulty walking,Increased muscle spasms,Improper body mechanics,Decreased range of motion,Impaired flexibility,Postural dysfunction  Visit Diagnosis: Cervicalgia  Acute bilateral low back pain without sciatica  Muscle weakness (generalized)  Abnormal posture  Difficulty in walking, not elsewhere classified     Problem List Patient Active Problem List   Diagnosis Date Noted  . 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 Suicide ideation 06/27/2019  . ADHD (attention deficit hyperactivity disorder), inattentive type 06/23/2019  . Overdose 06/23/2019  . Migraine headache 06/23/2019  . MDD (major depressive disorder), recurrent, severe, with psychosis ([place]) 02/22/2018    Percival Spanish, PT, MPT  03/18/2021, 5:11 PM  [facility] [address]  [place], [name], [address] Phone: [phone]   Fax:  [phone]  Name: Autumn Cortez MRN: [id number] Date of Birth: 2001-08-20

## 2021-03-19 ENCOUNTER — Telehealth: Payer: Self-pay

## 2021-03-19 DIAGNOSIS — G43009 Migraine without aura, not intractable, without status migrainosus: Secondary | ICD-10-CM

## 2021-03-19 NOTE — Telephone Encounter (Signed)
Initiated PA for patients Trokendi 25 mg via cover my meds.   KEY: [id number]  Waiting on determination.

## 2021-03-20 ENCOUNTER — Telehealth: Payer: Self-pay | Admitting: Family Medicine

## 2021-03-20 MED ORDER — TOPIRAMATE 25 MG PO TABS: ORAL_TABLET | ORAL | 2 refills | Status: DC

## 2021-03-20 NOTE — Telephone Encounter (Signed)
Spoke to Mom and she wants PCP to refer her to neurologist

## 2021-03-20 NOTE — Telephone Encounter (Signed)
Patient's Trokendi was denied by insurance Medicaid.   Please advise on alternative.   Covered alternatives include:  valproic acid capsule or solution (generic for Depakene), divalproex sprinkle capsule, extended release tablet, or immediate release tablet (generic for Depakote).

## 2021-03-20 NOTE — Telephone Encounter (Signed)
Called the mom and she has tried both propanolol and amitriptyline--neither worked for her.

## 2021-03-20 NOTE — Telephone Encounter (Signed)
According to her plan it looks like topiramate/topamax is covered but I am not 100%.

## 2021-03-20 NOTE — Telephone Encounter (Signed)
Spoke to mom and she has been on Topamax in the past and it caused memory problems. Is there another alternative

## 2021-03-20 NOTE — Telephone Encounter (Signed)
Plz let pt and mom know insurance doesn't cover Trokendi so I sent in Topamax and she will just take it twice daily. Ty.

## 2021-03-20 NOTE — Telephone Encounter (Signed)
Mom states pulmicort also need prior authorization

## 2021-03-20 NOTE — Addendum Note (Signed)
Addended by: Radene Gunning on: 03/20/2021 04:16 PM   Modules accepted: Orders

## 2021-03-20 NOTE — Telephone Encounter (Signed)
Do they cover any form of topiramate?

## 2021-03-20 NOTE — Telephone Encounter (Signed)
We could try propranolol or amitriptyline/Elavil.

## 2021-03-20 NOTE — Telephone Encounter (Signed)
We could try nortriptyline or refer to neuro. Ty.

## 2021-03-21 ENCOUNTER — Ambulatory Visit: Payer: BC Managed Care – PPO | Admitting: Physical Therapy

## 2021-03-21 ENCOUNTER — Encounter: Payer: Self-pay | Admitting: Physical Therapy

## 2021-03-21 ENCOUNTER — Other Ambulatory Visit: Payer: Self-pay

## 2021-03-21 DIAGNOSIS — M542 Cervicalgia: Secondary | ICD-10-CM | POA: Diagnosis not present

## 2021-03-21 DIAGNOSIS — M6281 Muscle weakness (generalized): Secondary | ICD-10-CM

## 2021-03-21 DIAGNOSIS — M545 Low back pain, unspecified: Secondary | ICD-10-CM

## 2021-03-21 DIAGNOSIS — R293 Abnormal posture: Secondary | ICD-10-CM

## 2021-03-21 DIAGNOSIS — R262 Difficulty in walking, not elsewhere classified: Secondary | ICD-10-CM

## 2021-03-21 NOTE — Therapy (Signed)
[name] [address]  [name], [name], [address] Phone: [phone]   Fax:  [phone]  Physical Therapy Treatment  Patient Details  Name: Autumn Cortez MRN: [id number] Date of Birth: December 21, 2001 Referring Provider (PT): Riki Sheer, DO   Encounter Date: 03/21/2021   PT End of Session - 03/21/21 1744    Visit Number 10    Number of Visits 20    Date for PT Re-Evaluation 04/25/21    Authorization Type UHC Medicaid    Authorization - Visit Number 10    Authorization - Number of Visits 27    Progress Note Due on Visit 18    PT Start Time 1703    PT Stop Time 1744    PT Time Calculation (min) 41 min    Activity Tolerance Patient tolerated treatment well    Behavior During Therapy [facility] for tasks assessed/performed           Past Medical History:  Diagnosis Date  . ADD (attention deficit disorder with hyperactivity)   . Anxiety   . Asthma   . Bipolar 1 disorder ([place])   . Migraines     Past Surgical History:  Procedure Laterality Date  . ADENOIDECTOMY  8/11  . TONSILLECTOMY      There were no vitals filed for this visit.   Subjective Assessment - 03/21/21 1704    Subjective "The DN wasn't as bad as I thought." Reports that she is feeling less tight in her upper shoulders.    Pertinent History migraines, bipolar 1, asthma, anxiety, ADD    Diagnostic tests per patient- xrays were done but unable to access    Patient Stated Goals decrease pain    Currently in Pain? Yes    Pain Score 4     Pain Location Back    Pain Orientation Right;Left;Lower    Pain Descriptors / Indicators Aching    Pain Type Acute pain                             [facility] Adult PT Treatment/Exercise - 03/21/21 0001      Neck Exercises: Seated   Shoulder Flexion Right;Left;10 reps    Shoulder Flexion Limitations with yellow TB   good ROM   Other Seated Exercise cervical extension SNAG to  tolerance 10x    Other Seated Exercise R/L scaption/D2 flexion 10x each with yellow TB      Lumbar Exercises: Stretches   Other Lumbar Stretch Exercise Seated prayer stretch to L with orange pball 5x5"      Lumbar Exercises: Aerobic   Nustep L4x56mn (UE/LEs)      Lumbar Exercises: Machines for Strengthening   Cybex Knee Extension B LEs 10x 20#, 10x 15#   difficulty achieving TKE, especially on R   Cybex Knee Flexion B LEs 2x10 20#   good control     Lumbar Exercises: Sidelying   Other Sidelying Lumbar Exercises R/L hip adduction 2x10 2#   good ROM and speed                   PT Short Term Goals - 03/14/21 1750      PT SHORT TERM GOAL #1   Title Patient to be independent with initial HEP.    Time 3    Period Weeks    Status Achieved    Target Date 02/21/21  PT Long Term Goals - 03/14/21 1750      PT LONG TERM GOAL #1   Title Patient to be independent with advanced HEP.    Time 6    Period Weeks    Status Partially Met   met for current   Target Date 04/25/21      PT LONG TERM GOAL #2   Title Patient to demonstrate B UE and LE strength >/=4+/5.    Time 6    Period Weeks    Status Partially Met   much improved; L shoulder flexion, R hip adduction, R knee flexion, and L knee extension strength limiting   Target Date 04/25/21      PT LONG TERM GOAL #3   Title Patient to demonstrate cervical AROM WFL and without pain limiting.    Time 6    Period Weeks    Status Partially Met   improved in all planes with exception of extension   Target Date 04/25/21      PT LONG TERM GOAL #4   Title Patient to demonstrate lumbar AROM WFL and without pain limiting.    Time 6    Period Weeks    Status Partially Met   good improvement in pain levels   Target Date 04/25/21      PT LONG TERM GOAL #5   Title Patient to report tolerance for standing and walking without limitation to fully return to school and work duties.    Time 6    Period Weeks    Status  Partially Met   limited to 3 hours on her feet at work   Target Date 04/25/21                 Plan - 03/21/21 1744    Clinical Impression Statement Patient reporting improvement in muscle tension from DN last session. Notes good benefit from TENs unit, however reports that she tries to avoid relying on it too much. Worked on B shoulder strengthening with overhead elevation. Patient required cues to maintain upright posture and avoiding hanging her head down throughout. Initiated LE machine strengthening with patient demonstrating difficulty achieving TKE, particularly on R LE with leg extensions. Hip adduction strengthening was progressed, with patient demonstrating good ROM and control. Patient's initial c/o LBP was eased with gentle stretching. Patient overall with good tolerance for session today as she reported no increase in pain from today's ther-ex.    Personal Factors and Comorbidities Age;Comorbidity 3+;Fitness;Past/Current Experience;Profession;Time since onset of injury/illness/exacerbation    Comorbidities migraines, bipolar 1, asthma, anxiety, ADD    PT Frequency 2x / week    PT Duration 6 weeks    PT Treatment/Interventions ADLs/Self Care Home Management;Cryotherapy;Electrical Stimulation;Moist Heat;Traction;Balance training;Therapeutic exercise;Therapeutic activities;Functional mobility training;Stair training;Gait training;Ultrasound;Neuromuscular re-education;Patient/family education;Manual techniques;Taping;Energy conservation;Dry needling;Passive range of motion    PT Next Visit Plan progress UE and LE strength and cervical/lumbar mobility    Consulted and Agree with Plan of Care Patient           Patient will benefit from skilled therapeutic intervention in order to improve the following deficits and impairments:  Hypomobility,Pain,Impaired UE functional use,Increased fascial restricitons,Decreased strength,Decreased activity tolerance,Difficulty walking,Increased muscle  spasms,Improper body mechanics,Decreased range of motion,Impaired flexibility,Postural dysfunction  Visit Diagnosis: Cervicalgia  Acute bilateral low back pain without sciatica  Muscle weakness (generalized)  Abnormal posture  Difficulty in walking, not elsewhere classified     Problem List Patient Active Problem List   Diagnosis Date Noted  . Bipolar and related  disorder due to another medical condition with mixed features   . Suicide ideation 06/27/2019  . ADHD (attention deficit hyperactivity disorder), inattentive type 06/23/2019  . Overdose 06/23/2019  . Migraine headache 06/23/2019  . MDD (major depressive disorder), recurrent, severe, with psychosis ([place]) 02/22/2018    Janene Harvey, PT, DPT 03/21/21 5:47 PM   [facility] [address]  [place], [name], [address] Phone: [phone]   Fax:  [phone]  Name: Autumn Cortez MRN: [id number] Date of Birth: [birth year]

## 2021-03-21 NOTE — Telephone Encounter (Signed)
Referral placed.

## 2021-03-21 NOTE — Telephone Encounter (Signed)
Called pt mother to advise that referral to neuro was placed. -JMA

## 2021-03-21 NOTE — Addendum Note (Signed)
Addended by: Radene Gunning on: 03/21/2021 07:32 AM   Modules accepted: Orders

## 2021-03-22 ENCOUNTER — Telehealth: Payer: Self-pay | Admitting: *Deleted

## 2021-03-22 NOTE — Telephone Encounter (Signed)
Sent prior auth thru cover my meds (Key: [id number]) and they denied rx because:  Per your health plan's criteria, this drug is covered if you meet the following: You have tried or cannot use two preferred drugs: Flovent Diskus, Flovent HFA, Pulmicort Respules ( 0.25mg  , 0.5mg  , 1mg )  Patient stated that she has tried Flovent before but not if it was the Diskus or HFA or both.  Awaiting message back from patient to see what she says.

## 2021-03-25 ENCOUNTER — Other Ambulatory Visit: Payer: Self-pay

## 2021-03-25 ENCOUNTER — Ambulatory Visit: Payer: BC Managed Care – PPO

## 2021-03-25 DIAGNOSIS — M6281 Muscle weakness (generalized): Secondary | ICD-10-CM

## 2021-03-25 DIAGNOSIS — R293 Abnormal posture: Secondary | ICD-10-CM

## 2021-03-25 DIAGNOSIS — M545 Low back pain, unspecified: Secondary | ICD-10-CM

## 2021-03-25 DIAGNOSIS — M542 Cervicalgia: Secondary | ICD-10-CM

## 2021-03-25 NOTE — Therapy (Signed)
[facility] [address]  [place], [name], [address] Phone: [phone]   Fax:  [phone]  Physical Therapy Treatment  Patient Details  Name: Autumn Cortez MRN: [id number] Date of Birth: 12-17-2001 Referring Provider (PT): Riki Sheer, [place]   Encounter Date: 03/25/2021   PT End of Session - 03/25/21 1608    Visit Number 11    Number of Visits 20    Date for PT Re-Evaluation 04/25/21    Authorization Type UHC Medicaid    Authorization - Visit Number 10    Authorization - Number of Visits 27    Progress Note Due on Visit 18    PT Start Time 1532    PT Stop Time 1611    PT Time Calculation (min) 39 min    Activity Tolerance Patient tolerated treatment well    Behavior During Therapy [facility] for tasks assessed/performed           Past Medical History:  Diagnosis Date  . ADD (attention deficit disorder with hyperactivity)   . Anxiety   . Asthma   . Bipolar 1 disorder ([place])   . Migraines     Past Surgical History:  Procedure Laterality Date  . ADENOIDECTOMY  8/11  . TONSILLECTOMY      There were no vitals filed for this visit.   Subjective Assessment - 03/25/21 1534    Subjective Pt reports she is feeling more tight in her upper shoulders today.    Pertinent History migraines, bipolar 1, asthma, anxiety, ADD    Diagnostic tests per patient- xrays were done but unable to access    Patient Stated Goals decrease pain    Currently in Pain? No/denies                             [facility] Adult PT Treatment/Exercise - 03/25/21 0001      Exercises   Exercises Neck;Lumbar;Other Exercises      Neck Exercises: Machines for Strengthening   UBE (Upper Arm Bike) L2x66mn      Neck Exercises: Theraband   Shoulder Extension 10 reps;Green    Rows 10 reps;Green      Neck Exercises: Standing   Other Standing Exercises B shld flex and rotations with ball on wall    Other Standing  Exercises ITY 10 reps each; shld shrug 10x w/ 4#      Lumbar Exercises: Supine   Bridge Compliant;20 reps;Limitations    Bridge Limitations 2x10 Gtband    Bridge with BCardinal HealthCompliant;10 reps    Straight Leg Raise 10 reps;2 seconds    Straight Leg Raises Limitations good quad control                    PT Short Term Goals - 03/14/21 1750      PT SHORT TERM GOAL #1   Title Patient to be independent with initial HEP.    Time 3    Period Weeks    Status Achieved    Target Date 02/21/21             PT Long Term Goals - 03/14/21 1750      PT LONG TERM GOAL #1   Title Patient to be independent with advanced HEP.    Time 6    Period Weeks    Status Partially Met   met for current   Target Date 04/25/21  PT LONG TERM GOAL #2   Title Patient to demonstrate B UE and LE strength >/=4+/5.    Time 6    Period Weeks    Status Partially Met   much improved; L shoulder flexion, R hip adduction, R knee flexion, and L knee extension strength limiting   Target Date 04/25/21      PT LONG TERM GOAL #3   Title Patient to demonstrate cervical AROM WFL and without pain limiting.    Time 6    Period Weeks    Status Partially Met   improved in all planes with exception of extension   Target Date 04/25/21      PT LONG TERM GOAL #4   Title Patient to demonstrate lumbar AROM WFL and without pain limiting.    Time 6    Period Weeks    Status Partially Met   good improvement in pain levels   Target Date 04/25/21      PT LONG TERM GOAL #5   Title Patient to report tolerance for standing and walking without limitation to fully return to school and work duties.    Time 6    Period Weeks    Status Partially Met   limited to 3 hours on her feet at work   Target Date 04/25/21                 Plan - 03/25/21 1610    Clinical Impression Statement Pt had no reports of pain during session. Noted she was more tight this session but was willing to focus mostly on ther  ex. She showed a good demonstration of the exercises, cues were given for retracted and depressed shoulder position to improve posture and shoulder mechanics. She did note that she has been using her TENS unit at home, declined estim post session today as she reported it gives her good relief at home. Still decreased ROM with the bridges mostly d/t tightness, pt responded well to session.    Personal Factors and Comorbidities Age;Comorbidity 3+;Fitness;Past/Current Experience;Profession;Time since onset of injury/illness/exacerbation    Comorbidities migraines, bipolar 1, asthma, anxiety, ADD    PT Frequency 2x / week    PT Duration 6 weeks    PT Treatment/Interventions ADLs/Self Care Home Management;Cryotherapy;Electrical Stimulation;Moist Heat;Traction;Balance training;Therapeutic exercise;Therapeutic activities;Functional mobility training;Stair training;Gait training;Ultrasound;Neuromuscular re-education;Patient/family education;Manual techniques;Taping;Energy conservation;Dry needling;Passive range of motion    PT Next Visit Plan progress UE and LE strength and cervical/lumbar mobility    Consulted and Agree with Plan of Care Patient           Patient will benefit from skilled therapeutic intervention in order to improve the following deficits and impairments:  Hypomobility,Pain,Impaired UE functional use,Increased fascial restricitons,Decreased strength,Decreased activity tolerance,Difficulty walking,Increased muscle spasms,Improper body mechanics,Decreased range of motion,Impaired flexibility,Postural dysfunction  Visit Diagnosis: Cervicalgia  Acute bilateral low back pain without sciatica  Muscle weakness (generalized)  Abnormal posture     Problem List Patient Active Problem List   Diagnosis Date Noted  . 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 Suicide ideation 06/27/2019  . ADHD (attention deficit hyperactivity disorder),  inattentive type 06/23/2019  . Overdose 06/23/2019  . Migraine headache 06/23/2019  . MDD (major depressive disorder), recurrent, severe, with psychosis ([place]) 02/22/2018    Artist Pais, PTA 03/25/2021, 4:46 PM  [facility] [address]  [place], [name], [address] Phone: [phone]   Fax:  [phone]  Name: Sharline Lehane MRN: [id number] Date of Birth: May 02, 2001

## 2021-03-25 NOTE — Telephone Encounter (Signed)
Called pharmacy and they have rx ready for pickup with no copay.  Patient notified on mychart.

## 2021-03-26 ENCOUNTER — Encounter: Payer: Self-pay | Admitting: Neurology

## 2021-03-28 ENCOUNTER — Ambulatory Visit: Payer: BC Managed Care – PPO | Admitting: Physical Therapy

## 2021-04-01 ENCOUNTER — Other Ambulatory Visit: Payer: Self-pay

## 2021-04-01 ENCOUNTER — Ambulatory Visit: Payer: BC Managed Care – PPO | Attending: Family Medicine

## 2021-04-01 DIAGNOSIS — M545 Low back pain, unspecified: Secondary | ICD-10-CM | POA: Insufficient documentation

## 2021-04-01 DIAGNOSIS — M6281 Muscle weakness (generalized): Secondary | ICD-10-CM | POA: Diagnosis not present

## 2021-04-01 DIAGNOSIS — R293 Abnormal posture: Secondary | ICD-10-CM | POA: Diagnosis not present

## 2021-04-01 DIAGNOSIS — M542 Cervicalgia: Secondary | ICD-10-CM | POA: Insufficient documentation

## 2021-04-01 NOTE — Therapy (Signed)
[facility] [address]  [place], [name], [address] Phone: [phone]   Fax:  [phone]  Physical Therapy Treatment  Patient Details  Name: Autumn Cortez MRN: [id number] Date of Birth: 2001/09/04 Referring Provider (PT): Riki Sheer, [place]   Encounter Date: 04/01/2021   PT End of Session - 04/01/21 1612    Visit Number 12    Number of Visits 20    Date for PT Re-Evaluation 04/25/21    Authorization Type UHC Medicaid    Authorization - Visit Number 11    Authorization - Number of Visits 27    Progress Note Due on Visit 18    PT Start Time 1532    PT Stop Time 1612    PT Time Calculation (min) 40 min    Activity Tolerance Patient tolerated treatment well    Behavior During Therapy [facility] for tasks assessed/performed           Past Medical History:  Diagnosis Date  . ADD (attention deficit disorder with hyperactivity)   . Anxiety   . Asthma   . Bipolar 1 disorder ([place])   . Migraines     Past Surgical History:  Procedure Laterality Date  . ADENOIDECTOMY  8/11  . TONSILLECTOMY      There were no vitals filed for this visit.   Subjective Assessment - 04/01/21 1538    Subjective Pt reports she feels like she is back to normal almost.    Pertinent History migraines, bipolar 1, asthma, anxiety, ADD    Diagnostic tests per patient- xrays were done but unable to access    Patient Stated Goals decrease pain    Currently in Pain? No/denies                             [facility] Adult PT Treatment/Exercise - 04/01/21 0001      Exercises   Exercises Knee/Hip      Neck Exercises: Machines for Strengthening   UBE (Upper Arm Bike) L2x26mn fwd/ 3 min back      Lumbar Exercises: Seated   Sit to Stand 20 reps      Knee/Hip Exercises: Machines for Strengthening   Cybex Knee Extension 10# 10 reps B    Cybex Knee Flexion 20# 10 reps B    Cybex Leg Press 10 reps 20#, cues for knee  positoning and form      Knee/Hip Exercises: Seated   Long Arc Quad Strengthening;Both;1 set;10 reps;Weights    Long Arc Quad Weight 2 lbs.      Knee/Hip Exercises: Supine   Bridges Strengthening;Both;15 reps;Limitations    Bridges Limitations Gtband; arms crossed    Straight Leg Raises Strengthening;Both;1 set;10 reps;Limitations    Straight Leg Raises Limitations 2# , slow and controlled      Knee/Hip Exercises: Sidelying   Clams G tband 10x3"                    PT Short Term Goals - 03/14/21 1750      PT SHORT TERM GOAL #1   Title Patient to be independent with initial HEP.    Time 3    Period Weeks    Status Achieved    Target Date 02/21/21             PT Long Term Goals - 03/14/21 1750      PT LONG TERM GOAL #1  Title Patient to be independent with advanced HEP.    Time 6    Period Weeks    Status Partially Met   met for current   Target Date 04/25/21      PT LONG TERM GOAL #2   Title Patient to demonstrate B UE and LE strength >/=4+/5.    Time 6    Period Weeks    Status Partially Met   much improved; L shoulder flexion, R hip adduction, R knee flexion, and L knee extension strength limiting   Target Date 04/25/21      PT LONG TERM GOAL #3   Title Patient to demonstrate cervical AROM WFL and without pain limiting.    Time 6    Period Weeks    Status Partially Met   improved in all planes with exception of extension   Target Date 04/25/21      PT LONG TERM GOAL #4   Title Patient to demonstrate lumbar AROM WFL and without pain limiting.    Time 6    Period Weeks    Status Partially Met   good improvement in pain levels   Target Date 04/25/21      PT LONG TERM GOAL #5   Title Patient to report tolerance for standing and walking without limitation to fully return to school and work duties.    Time 6    Period Weeks    Status Partially Met   limited to 3 hours on her feet at work   Target Date 04/25/21                 Plan -  04/01/21 1613    Clinical Impression Statement Pt cont to respond well to progressed treatments. No reports of pain during session. Focused more on knee/hip and lumbar strengthening today d/t pt reports of doing better in upper shoulders. Cueing needed to keep from IR of L LE during WB exercises and to isolate the correct muscles during the exercises. Pt showed good performance of the exercises once cues were given to correct technique. She plans on making her next visit the last, she reports that her HEP exercises are still just right for her and provide a challenge, will plan to measure pt progress next session.    Personal Factors and Comorbidities Age;Comorbidity 3+;Fitness;Past/Current Experience;Profession;Time since onset of injury/illness/exacerbation    Comorbidities migraines, bipolar 1, asthma, anxiety, ADD    PT Frequency 2x / week    PT Duration 6 weeks    PT Treatment/Interventions ADLs/Self Care Home Management;Cryotherapy;Electrical Stimulation;Moist Heat;Traction;Balance training;Therapeutic exercise;Therapeutic activities;Functional mobility training;Stair training;Gait training;Ultrasound;Neuromuscular re-education;Patient/family education;Manual techniques;Taping;Energy conservation;Dry needling;Passive range of motion    PT Next Visit Plan progress UE and LE strength and cervical/lumbar mobility    Consulted and Agree with Plan of Care Patient           Patient will benefit from skilled therapeutic intervention in order to improve the following deficits and impairments:  Hypomobility,Pain,Impaired UE functional use,Increased fascial restricitons,Decreased strength,Decreased activity tolerance,Difficulty walking,Increased muscle spasms,Improper body mechanics,Decreased range of motion,Impaired flexibility,Postural dysfunction  Visit Diagnosis: Cervicalgia  Acute bilateral low back pain without sciatica  Muscle weakness (generalized)  Abnormal posture     Problem  List Patient Active Problem List   Diagnosis Date Noted  . 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 Suicide ideation 06/27/2019  . ADHD (attention deficit hyperactivity disorder), inattentive type 06/23/2019  . Overdose 06/23/2019  . Migraine headache  06/23/2019  . MDD (major depressive disorder), recurrent, severe, with psychosis ([place]) 02/22/2018    Artist Pais, PTA 04/01/2021, 4:17 PM  [facility] [address]  [place], [name], [address] Phone: [phone]   Fax:  [phone]  Name: Autumn Cortez MRN: [id number] Date of Birth: 27-Feb-2001

## 2021-04-04 ENCOUNTER — Other Ambulatory Visit: Payer: Self-pay

## 2021-04-04 ENCOUNTER — Ambulatory Visit: Payer: BC Managed Care – PPO

## 2021-04-04 DIAGNOSIS — M545 Low back pain, unspecified: Secondary | ICD-10-CM | POA: Diagnosis not present

## 2021-04-04 DIAGNOSIS — M6281 Muscle weakness (generalized): Secondary | ICD-10-CM | POA: Diagnosis not present

## 2021-04-04 DIAGNOSIS — R293 Abnormal posture: Secondary | ICD-10-CM | POA: Diagnosis not present

## 2021-04-04 DIAGNOSIS — M542 Cervicalgia: Secondary | ICD-10-CM

## 2021-04-04 NOTE — Therapy (Signed)
[facility] [address]  [place], [name], [address] Phone: [phone]   Fax:  [phone]  Physical Therapy Discharge Summary  Patient Details  Name: Autumn Cortez MRN: [id number] Date of Birth: [birth year] Referring Provider (PT): Riki Sheer, DO   Encounter Date: 04/04/2021   PT End of Session - 04/04/21 1613    Visit Number 13    Number of Visits 20    Date for PT Re-Evaluation 04/25/21    Authorization Type UHC Medicaid    Authorization - Visit Number 12    Authorization - Number of Visits 27    Progress Note Due on Visit 18    PT Start Time 1532    PT Stop Time 1610    PT Time Calculation (min) 38 min    Activity Tolerance Patient tolerated treatment well    Behavior During Therapy [facility] for tasks assessed/performed           Past Medical History:  Diagnosis Date  . ADD (attention deficit disorder with hyperactivity)   . Anxiety   . Asthma   . Bipolar 1 disorder ([place])   . Migraines     Past Surgical History:  Procedure Laterality Date  . ADENOIDECTOMY  8/11  . TONSILLECTOMY      There were no vitals filed for this visit.   Subjective Assessment - 04/04/21 1534    Subjective Pt reports that she is doing pretty good.    Pertinent History migraines, bipolar 1, asthma, anxiety, ADD    Diagnostic tests per patient- xrays were done but unable to access    Patient Stated Goals decrease pain    Currently in Pain? No/denies              [facility] PT Assessment - 04/04/21 0001      AROM   Cervical Flexion WFL    Cervical Extension WFL    Cervical - Right Side Bend WFL    Cervical - Left Side Bend 25% limited    Cervical - Right Rotation 25% limited    Cervical - Left Rotation 25% limited    Lumbar Flexion hands down to toes;WFL    Lumbar Extension WFL    Lumbar - Right Side Bend hand to midpoint of tibia: WFL    Lumbar - Left Side Bend hand to midpoint of tibia; [facility]    Lumbar  - Right Rotation [facility]    Lumbar - Left Rotation [facility]      Strength   Right Shoulder Flexion 4+/5    Right Shoulder ABduction 4+/5    Right Shoulder Internal Rotation 4+/5    Right Shoulder External Rotation 4+/5    Left Shoulder Flexion 4+/5    Left Shoulder ABduction 4+/5    Left Shoulder Internal Rotation 4+/5    Left Shoulder External Rotation 4+/5    Right Hip Flexion 4+/5    Right Hip ABduction 4+/5    Right Hip ADduction 4+/5    Left Hip Flexion 4+/5    Left Hip ABduction 4+/5    Left Hip ADduction 4+/5    Right Knee Flexion 4+/5    Right Knee Extension 4+/5    Left Knee Flexion 4+/5    Left Knee Extension 4+/5                         [facility] Adult PT Treatment/Exercise - 04/04/21 0001      Exercises  Exercises Knee/Hip      Neck Exercises: Machines for Strengthening   UBE (Upper Arm Bike) L2x78mn fwd/ 3 min back      Lumbar Exercises: Supine   Bridge Compliant;10 reps;3 seconds    Bridge Limitations Gtband      Lumbar Exercises: Sidelying   Clam Left;10 reps;3 seconds                  PT Education - 04/04/21 1612    Education Details HEP update; Access Code: [email or web address] Reviewed HEP with pt to ensure understanding and make modifications as neccessary.    Person(s) Educated Patient    Methods Explanation;Demonstration;Verbal cues;Handout    Comprehension Verbalized understanding;Returned demonstration;Verbal cues required;Need further instruction            PT Short Term Goals - 03/14/21 1750      PT SHORT TERM GOAL #1   Title Patient to be independent with initial HEP.    Time 3    Period Weeks    Status Achieved    Target Date 02/21/21             PT Long Term Goals - 04/04/21 1541      PT LONG TERM GOAL #1   Title Patient to be independent with advanced HEP.    Time 6    Period Weeks    Status Achieved      PT LONG TERM GOAL #2   Title Patient to demonstrate B UE and LE strength >/=4+/5.    Time 6    Period Weeks     Status Achieved      PT LONG TERM GOAL #3   Title Patient to demonstrate cervical AROM WFL and without pain limiting.    Time 6    Period Weeks    Status Achieved      PT LONG TERM GOAL #4   Title Patient to demonstrate lumbar AROM WFL and without pain limiting.    Time 6    Period Weeks    Status Achieved      PT LONG TERM GOAL #5   Title Patient to report tolerance for standing and walking without limitation to fully return to school and work duties.    Time 6    Period Weeks    Status Achieved   can stand the whole work day w/o pain limiting                Plan - 04/04/21 1614    Clinical Impression Statement Pt reported to clinic feeling much improvement. She was showing independence with her ongoing HEP with verbal understanding and demonstration. Cervical and lumbar ROM grossly WFL and with no reports of pain with any motions, still slight limitations with L cervical side bends and rotation. Pt UE and LE strength grossly is 4+/5. She reported being able to tolerate a full day at work and school w/o being limited by pain or ROM and has returned to her everyday function w/o limitations. Updated HEP and reviewed with pt to ensure understanding before D/C, she demonstrated and verbalized understanding. She has met all of her LTGs and is ready for D/C to fully return to her everyday work/school activites.    Personal Factors and Comorbidities Age;Comorbidity 3+;Fitness;Past/Current Experience;Profession;Time since onset of injury/illness/exacerbation    Comorbidities migraines, bipolar 1, asthma, anxiety, ADD    PT Frequency 2x / week    PT Duration 6 weeks    PT Treatment/Interventions ADLs/Self Care Home Management;Cryotherapy;Electrical Stimulation;Moist  Heat;Traction;Balance training;Therapeutic exercise;Therapeutic activities;Functional mobility training;Stair training;Gait training;Ultrasound;Neuromuscular re-education;Patient/family education;Manual  techniques;Taping;Energy conservation;Dry needling;Passive range of motion    PT Next Visit Plan progress UE and LE strength and cervical/lumbar mobility    Consulted and Agree with Plan of Care Patient           Patient will benefit from skilled therapeutic intervention in order to improve the following deficits and impairments:  Hypomobility,Pain,Impaired UE functional use,Increased fascial restricitons,Decreased strength,Decreased activity tolerance,Difficulty walking,Increased muscle spasms,Improper body mechanics,Decreased range of motion,Impaired flexibility,Postural dysfunction  Visit Diagnosis: Cervicalgia  Acute bilateral low back pain without sciatica  Muscle weakness (generalized)  Abnormal posture     Problem List Patient Active Problem List   Diagnosis Date Noted  . 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 Suicide ideation 06/27/2019  . ADHD (attention deficit hyperactivity disorder), inattentive type 06/23/2019  . Overdose 06/23/2019  . Migraine headache 06/23/2019  . MDD (major depressive disorder), recurrent, severe, with psychosis ([place]) 02/22/2018    Artist Pais, PTA 04/04/2021, 5:04 PM  [facility] [address]  [place], [name], [address] Phone: [phone]   Fax:  [phone]  Name: Autumn Cortez MRN: [id number] Date of Birth: January 09, 2001

## 2021-04-11 ENCOUNTER — Encounter: Payer: BC Managed Care – PPO | Admitting: Physical Therapy

## 2021-04-18 ENCOUNTER — Encounter: Payer: BC Managed Care – PPO | Admitting: Physical Therapy

## 2021-04-23 ENCOUNTER — Other Ambulatory Visit: Payer: Self-pay | Admitting: Family Medicine

## 2021-04-23 ENCOUNTER — Other Ambulatory Visit: Payer: Self-pay

## 2021-04-23 ENCOUNTER — Other Ambulatory Visit (INDEPENDENT_AMBULATORY_CARE_PROVIDER_SITE_OTHER): Payer: BC Managed Care – PPO

## 2021-04-23 DIAGNOSIS — E785 Hyperlipidemia, unspecified: Secondary | ICD-10-CM

## 2021-04-23 LAB — LIPID PANEL
Cholesterol: 185 mg/dL (ref 0–200)
HDL: 67.5 mg/dL (ref >39.00)
LDL Cholesterol: 77 mg/dL (ref 0–99)
NonHDL: 117.04
Total CHOL/HDL Ratio: 3
Triglycerides: 200 mg/dL — ABNORMAL HIGH (ref 0.0–149.0)
VLDL: 40 mg/dL (ref 0.0–40.0)

## 2021-04-24 ENCOUNTER — Other Ambulatory Visit: Payer: Self-pay | Admitting: Family Medicine

## 2021-04-24 DIAGNOSIS — E785 Hyperlipidemia, unspecified: Secondary | ICD-10-CM

## 2021-04-25 ENCOUNTER — Ambulatory Visit (INDEPENDENT_AMBULATORY_CARE_PROVIDER_SITE_OTHER): Payer: BC Managed Care – PPO | Admitting: Obstetrics and Gynecology

## 2021-04-25 ENCOUNTER — Other Ambulatory Visit: Payer: Self-pay

## 2021-04-25 ENCOUNTER — Other Ambulatory Visit (HOSPITAL_COMMUNITY)
Admission: RE | Admit: 2021-04-25 | Discharge: 2021-04-25 | Disposition: A | Discharge status: Home / Self Care | Payer: BC Managed Care – PPO | Source: Ambulatory Visit | Attending: Obstetrics and Gynecology | Admitting: Obstetrics and Gynecology

## 2021-04-25 ENCOUNTER — Encounter: Payer: Self-pay | Admitting: Obstetrics and Gynecology

## 2021-04-25 VITALS — BP 127/81 | HR 120 | Ht 61.5 in | Wt 107.0 lb

## 2021-04-25 DIAGNOSIS — R102 Pelvic and perineal pain: Secondary | ICD-10-CM | POA: Diagnosis not present

## 2021-04-25 NOTE — Progress Notes (Signed)
20 yo P0 presenting for the evaluation of lower abdominal pain for the past 3-4 months. Patient is sexually active using COC for contraception. She reports a monthly period lasting 5 days. She denies any abnormal discharge. She reports an intermittent lower abdominal pain in the region of her ovaries which alternates between right and left side within the same month. The pain is transient and resolves within a few minutes following rest  Past Medical History:  Diagnosis Date  . ADD (attention deficit disorder with hyperactivity)   . Anxiety   . Asthma   . Bipolar 1 disorder ([facility])   . Migraines    Past Surgical History:  Procedure Laterality Date  . ADENOIDECTOMY  8/11  . TONSILLECTOMY     Family History  Problem Relation Age of Onset  . Hypertension Mother   . Migraines Mother   . Asthma Mother   . Heart disease Mother   . Cancer Maternal Grandmother   . Diabetes Paternal Grandfather   . Hypertension Paternal Grandfather   . Heart disease Paternal Grandfather   . Post-traumatic stress disorder Father   . Bipolar disorder Father    Social History   Tobacco Use  . Smoking status: Current Every Day Smoker    Types: E-cigarettes  . Smokeless tobacco: Never Used  Vaping Use  . Vaping Use: Every day  Substance Use Topics  . Alcohol use: No  . Drug use: Not Currently    Comment: HX OF MARIJUANA   ROS See pertinent in HPI. All other systems reviewed and non contributory  Blood pressure 127/81, pulse (!) 120, height 5' 1.5" (1.562 m), weight 107 lb (48.5 kg), last menstrual period 04/18/2021. GENERAL: Well-developed, well-nourished female in no acute distress.  ABDOMEN: Soft, nontender, nondistended. No organomegaly. PELVIC: Normal external female genitalia. Vagina is pink and rugated.  Normal discharge. Normal appearing cervix. Uterus is normal in size.  No adnexal mass or tenderness. EXTREMITIES: No cyanosis, clubbing, or edema, 2+ distal pulses.  A/P 20 yo with pelvic  pain - vaginal swab collected to rule out infectious process - pelvic ultrasound ordered - Patient will be contacted with abnormal results - RTC prn

## 2021-04-25 NOTE — Progress Notes (Signed)
Pt presents today as new patient for evaluation for pelvic pain. Pt states that the pain is bilateral and consistent. It does not only occur when she is on her cycle. Pt states that the pain started about 3-4 months ago. When the pain is at its worst it is about 8/10 on the pain scale. Pt states that bowels and bladder are functioning fine. No issues or concerns there. LMP: 04/18/21. BCM: Yaz. She is sexually active but denies any intercourse since February.

## 2021-04-26 LAB — CERVICOVAGINAL ANCILLARY ONLY
Bacterial Vaginitis (gardnerella): POSITIVE — AB
Candida Glabrata: NEGATIVE
Candida Vaginitis: NEGATIVE
Chlamydia: POSITIVE — AB
Comment: NEGATIVE
Comment: NEGATIVE
Comment: NEGATIVE
Comment: NEGATIVE
Comment: NEGATIVE
Comment: NORMAL
Neisseria Gonorrhea: NEGATIVE
Trichomonas: NEGATIVE

## 2021-04-29 ENCOUNTER — Other Ambulatory Visit: Payer: Self-pay

## 2021-04-29 DIAGNOSIS — Z113 Encounter for screening for infections with a predominantly sexual mode of transmission: Secondary | ICD-10-CM

## 2021-04-29 MED ORDER — AZITHROMYCIN 500 MG PO TABS: 1000.0000 mg | ORAL_TABLET | Freq: Once | ORAL | 1 refills | Status: AC

## 2021-04-29 MED ORDER — METRONIDAZOLE 500 MG PO TABS: 500.0000 mg | ORAL_TABLET | Freq: Two times a day (BID) | ORAL | 0 refills | Status: DC

## 2021-04-29 NOTE — Addendum Note (Signed)
Addended by: Catalina Antigua on: 04/29/2021 10:25 AM   Modules accepted: Orders

## 2021-05-01 ENCOUNTER — Ambulatory Visit (HOSPITAL_COMMUNITY)
Admission: RE | Admit: 2021-05-01 | Discharge: 2021-05-01 | Disposition: A | Discharge status: Home / Self Care | Payer: BC Managed Care – PPO | Source: Ambulatory Visit | Attending: Obstetrics and Gynecology | Admitting: Obstetrics and Gynecology

## 2021-05-01 ENCOUNTER — Other Ambulatory Visit: Payer: Self-pay | Admitting: Obstetrics and Gynecology

## 2021-05-01 ENCOUNTER — Other Ambulatory Visit: Payer: Self-pay

## 2021-05-01 DIAGNOSIS — N854 Malposition of uterus: Secondary | ICD-10-CM | POA: Diagnosis not present

## 2021-05-01 DIAGNOSIS — R102 Pelvic and perineal pain: Secondary | ICD-10-CM | POA: Diagnosis not present

## 2021-05-13 DIAGNOSIS — F3489 Other specified persistent mood disorders: Secondary | ICD-10-CM | POA: Diagnosis not present

## 2021-05-13 DIAGNOSIS — F411 Generalized anxiety disorder: Secondary | ICD-10-CM | POA: Diagnosis not present

## 2021-05-13 DIAGNOSIS — F900 Attention-deficit hyperactivity disorder, predominantly inattentive type: Secondary | ICD-10-CM | POA: Diagnosis not present

## 2021-05-28 NOTE — Progress Notes (Signed)
NEUROLOGY CONSULTATION NOTE  Autumn Cortez MRN: [id number] DOB: August 13, 2001  Referring provider: Arva Chafe, DO Primary care provider: Arva Chafe, DO  Reason for consult:  migraines  Assessment/Plan:   Chronic migraine without aura, without status migrainosus, not intractable  1.  Migraine prevention:  Start Qulipta 60mg  daily.  If no improvement in 3 months, she will contact me and we can switch to a CGRP inhibitor injection. 2.  Migraine rescue:  Rizatriptan 10mg  with ondansetron 8mg  3.  Limit use of pain relievers to no more than 2 days out of week to prevent risk of rebound or medication-overuse headache. 4.  Keep headache diary 5.  Lifestyle modification:  Discussed hydration, diet, sleep hygiene 6.  Follow up 6 months.   Subjective:  Autumn Cortez is an 20 year old right-handed female with ADD, Bipolar 1 disorder and anxiety who presents for migraines.  History supplemented by referring provider's note.  She is accompanied by her mother.  Onset:  20 years old Location:  starts across frontal region and migrates to entire head Quality:  pounding Intensity:  Moderate to severe Aura:  none Prodrome:  none Associated symptoms:  Nausea, vomiting, photophobia.  She denies associated vomiting, phonophobia, visual disturbance, unilateral numbness or weakness. Duration:  Usually starts in evening and will need to fall asleep and gone when she wakes up.  Otherwise, may occur all day Frequency:  Once a week a severe migraine, two days a week a headache without nausea Frequency of abortive medication: Takes Tylenol about 2 days a week Triggers:  unknown Relieving factors:  sleep Activity:  Movement aggravates  Current NSAIDS/analgesics:  none Current triptans:  Tylenol Current ergotamine:  none Current anti-emetic:  none Current muscle relaxants:  none Current Antihypertensive medications:  none Current Antidepressant medications:  none Current  Anticonvulsant medications:  lamotrigine 100mg  BID Current anti-CGRP:  none Current Vitamins/Herbal/Supplements:  none Current Antihistamines/Decongestants:  none Other therapy:  none Hormone/birth control:  Yaz Other medications:  Seroquel, Adderall XR  Past NSAIDS/analgesics:  Ibuprofen, naproxen, Excedrin, meloxicam Past abortive triptans:  none Past abortive ergotamine:  none Past muscle relaxants:  Flexeril Past anti-emetic:  none Past antihypertensive medications:  propranolol Past antidepressant medications:  Venlafaxine, citalopram Past anticonvulsant medications:  topiramate Past anti-CGRP:  none Past vitamins/Herbal/Supplements:  none Past antihistamines/decongestants:  none Other past therapies:  Cold rag/ice pack on head or behind neck, lay down with fan on  Caffeine:  Used to drink coffee daily.  Now drinks coffee once a month.  Does not drink soda often Diet:  A little over 32 oz water daily.  Rarely soda.  Sometimes skips meals Exercise:  no Depression:  no; Anxiety:  no Other pain:  no Sleep:  Last few weeks it has been poor. Trouble falling asleep and staying asleep.  Takes quetiapine to help sleep (as well as for her Bipolar) Family history of headache:  Mother, uncle, maternal greatgrandmother   CBC and CMP from March reviewed.  PAST MEDICAL HISTORY: Past Medical History:  Diagnosis Date  . ADD (attention deficit disorder with hyperactivity)   . Anxiety   . Asthma   . Bipolar 1 disorder (HCC)   . Migraines     PAST SURGICAL HISTORY: Past Surgical History:  Procedure Laterality Date  . ADENOIDECTOMY  8/11  . TONSILLECTOMY      MEDICATIONS: Current Outpatient Medications on File Prior to Visit  Medication Sig Dispense Refill  . albuterol (VENTOLIN HFA) 108 (90 Base) MCG/ACT inhaler Inhale 2  puffs into the lungs every 6 (six) hours as needed for shortness of breath. 18 g 3  . amphetamine-dextroamphetamine (ADDERALL XR) 20 MG 24 hr capsule Take 20 mg  by mouth daily.     . budesonide (PULMICORT) 180 MCG/ACT inhaler Inhale 1 puff into the lungs 2 (two) times daily. 3 each 2  . cyclobenzaprine (FLEXERIL) 10 MG tablet Take 10 mg by mouth 3 (three) times daily as needed for muscle spasms. (Patient not taking: Reported on 04/25/2021)    . drospirenone-ethinyl estradiol (YAZ) 3-0.02 MG tablet Take 1 tablet by mouth daily. 84 tablet 3  . fluticasone (FLONASE) 50 MCG/ACT nasal spray Place 2 sprays into both nostrils daily. 16 g 1  . lamoTRIgine (LAMICTAL) 100 MG tablet Take 100 mg by mouth 2 (two) times daily.    . meloxicam (MOBIC) 15 MG tablet Take 1 tablet (15 mg total) by mouth daily. (Patient not taking: Reported on 04/25/2021) 30 tablet 0  . metroNIDAZOLE (FLAGYL) 500 MG tablet Take 1 tablet (500 mg total) by mouth 2 (two) times daily. 14 tablet 0  . QUEtiapine (SEROQUEL) 200 MG tablet Take 1 tablet (200 mg total) by mouth at bedtime as needed (sleep). 30 tablet 0  . topiramate (TOPAMAX) 25 MG tablet Take 1 tab daily for 3 days and then take 1 tab twice daily. 60 tablet 2   No current facility-administered medications on file prior to visit.    ALLERGIES: Allergies  Allergen Reactions  . Pollen Extract Other (See Comments)    Seasonal allergies - runny nose, sneezing, watery eyes Congestion and sneezing    FAMILY HISTORY: Family History  Problem Relation Age of Onset  . Hypertension Mother   . Migraines Mother   . Asthma Mother   . Heart disease Mother   . Cancer Maternal Grandmother   . Diabetes Paternal Grandfather   . Hypertension Paternal Grandfather   . Heart disease Paternal Grandfather   . Post-traumatic stress disorder Father   . Bipolar disorder Father     Objective:  Blood pressure 125/78, pulse (!) 102, height 5\' 1"  (1.549 m), weight 107 lb (48.5 kg), SpO2 99 %. General: No acute distress.  Patient appears well-groomed.   Head:  Normocephalic/atraumatic Eyes:  fundi examined but not visualized Neck: supple, no  paraspinal tenderness, full range of motion Back: No paraspinal tenderness Heart: regular rate and rhythm Lungs: Clear to auscultation bilaterally. Vascular: No carotid bruits. Neurological Exam: Mental status: alert and oriented to person, place, and time, recent and remote memory intact, fund of knowledge intact, attention and concentration intact, speech fluent and not dysarthric, language intact. Cranial nerves: CN I: not tested CN II: pupils equal, round and reactive to light, visual fields intact CN III, IV, VI:  full range of motion, no nystagmus, no ptosis CN V: facial sensation intact. CN VII: upper and lower face symmetric CN VIII: hearing intact CN IX, X: gag intact, uvula midline CN XI: sternocleidomastoid and trapezius muscles intact CN XII: tongue midline Bulk & Tone: normal, no fasciculations. Motor:  muscle strength 5/5 throughout Sensation:  Pinprick, temperature and vibratory sensation intact. Deep Tendon Reflexes:  2+ throughout,  toes downgoing.   Finger to nose testing:  Without dysmetria.   Heel to shin:  Without dysmetria.   Gait:  Normal station and stride.  Romberg negative.    Thank you for allowing me to take part in the care of this patient.  , DO  CC: Shon Millet, DO

## 2021-05-30 ENCOUNTER — Encounter: Payer: Self-pay | Admitting: Neurology

## 2021-05-30 ENCOUNTER — Other Ambulatory Visit: Payer: Self-pay

## 2021-05-30 ENCOUNTER — Ambulatory Visit (INDEPENDENT_AMBULATORY_CARE_PROVIDER_SITE_OTHER): Payer: BC Managed Care – PPO | Admitting: Neurology

## 2021-05-30 VITALS — BP 125/78 | HR 102 | Ht 61.0 in | Wt 107.0 lb

## 2021-05-30 DIAGNOSIS — G43709 Chronic migraine without aura, not intractable, without status migrainosus: Secondary | ICD-10-CM

## 2021-05-30 MED ORDER — QULIPTA 60 MG PO TABS: 60.0000 mg | ORAL_TABLET | Freq: Every day | ORAL | 5 refills | Status: DC

## 2021-05-30 MED ORDER — ONDANSETRON 8 MG PO TBDP: 8.0000 mg | ORAL_TABLET | Freq: Three times a day (TID) | ORAL | 5 refills | Status: DC | PRN

## 2021-05-30 MED ORDER — RIZATRIPTAN BENZOATE 10 MG PO TBDP: 10.0000 mg | ORAL_TABLET | ORAL | 5 refills | Status: DC | PRN

## 2021-05-30 NOTE — Patient Instructions (Signed)
  1. Start Qulipta 60mg  daily.  Contact in 3 months with update and we can increase dose if needed. 2. Take rizatriptan 10mg  at earliest onset of headache.  May repeat dose once in 2 hours if needed.  Maximum 2 tablets in 24 hours.  Also take ondansetron at earliest onset of migraine for nausea. 3. Limit use of pain relievers to no more than 2 days out of the week.  These medications include acetaminophen, NSAIDs (ibuprofen/Advil/Motrin, naproxen/Aleve, triptans (Imitrex/sumatriptan), Excedrin, and narcotics.  This will help reduce risk of rebound headaches. 4. Be aware of common food triggers:  - Caffeine:  coffee, black tea, cola, Mt. Dew  - Chocolate  - Dairy:  aged cheeses (brie, blue, cheddar, gouda, Harmon, provolone, Tibes, Swiss, etc), chocolate milk, buttermilk, sour cream, limit eggs and yogurt  - Nuts, peanut butter  - Alcohol  - Cereals/grains:  FRESH breads (fresh bagels, sourdough, doughnuts), yeast productions  - Processed/canned/aged/cured meats (pre-packaged deli meats, hotdogs)  - MSG/glutamate:  soy sauce, flavor enhancer, pickled/preserved/marinated foods  - Sweeteners:  aspartame (Equal, Nutrasweet).  Sugar and Splenda are okay  - Vegetables:  legumes (lima beans, lentils, snow peas, fava beans, pinto peans, peas, garbanzo beans), sauerkraut, onions, olives, pickles  - Fruit:  avocados, bananas, citrus fruit (orange, lemon, grapefruit), mango  - Other:  Frozen meals, macaroni and cheese 5. Routine exercise 6. Stay adequately hydrated (aim for 64 oz water daily) 7. Keep headache diary 8. Maintain proper stress management 9. Maintain proper sleep hygiene 10. Do not skip meals 11. Consider supplements:  magnesium citrate 400mg  daily, riboflavin 400mg  daily, coenzyme Q10 100mg  three times daily.

## 2021-05-31 NOTE — Progress Notes (Unsigned)
Autumn Cortez (Key: [id number])  Your information has been submitted to [employer]. [employer] is reviewing the PA request and you will receive an electronic response. You may check for the updated outcome later by reopening this request. The standard fax determination will also be sent to you directly.  If you have any questions about your PA submission, contact [employer] at [phone].

## 2021-06-04 NOTE — Progress Notes (Unsigned)
Autumn Cortez (KeyDickey Gave) - [id number] Qulipta 60MG  tablets     Status: PA Response - Denied.  Will send Form to 

## 2021-06-12 ENCOUNTER — Other Ambulatory Visit: Payer: Self-pay

## 2021-06-12 ENCOUNTER — Other Ambulatory Visit (HOSPITAL_COMMUNITY)
Admission: RE | Admit: 2021-06-12 | Discharge: 2021-06-12 | Disposition: A | Discharge status: Home / Self Care | Payer: BC Managed Care – PPO | Source: Ambulatory Visit | Attending: Obstetrics and Gynecology | Admitting: Obstetrics and Gynecology

## 2021-06-12 ENCOUNTER — Ambulatory Visit (INDEPENDENT_AMBULATORY_CARE_PROVIDER_SITE_OTHER): Payer: BC Managed Care – PPO | Admitting: *Deleted

## 2021-06-12 DIAGNOSIS — Z8619 Personal history of other infectious and parasitic diseases: Secondary | ICD-10-CM | POA: Insufficient documentation

## 2021-06-12 DIAGNOSIS — Z113 Encounter for screening for infections with a predominantly sexual mode of transmission: Secondary | ICD-10-CM

## 2021-06-12 NOTE — Progress Notes (Signed)
Agree with A & P. 

## 2021-06-12 NOTE — Progress Notes (Signed)
Pt is in office for TOC due to hx of +CH. Pt also needs full STD panel lab work as well- will draw today.  Pt made aware she will be called with any abnormal results.  Pt has no other concerns today.

## 2021-06-13 LAB — RPR+HBSAG+HCVAB+...
HIV Screen 4th Generation wRfx: NONREACTIVE
Hep C Virus Ab: <0.1 s/co ratio (ref 0.0–0.9)
Hepatitis B Surface Ag: NEGATIVE
RPR Ser Ql: NONREACTIVE

## 2021-06-13 LAB — CERVICOVAGINAL ANCILLARY ONLY
Chlamydia: NEGATIVE
Comment: NEGATIVE
Comment: NORMAL
Neisseria Gonorrhea: NEGATIVE

## 2021-06-24 ENCOUNTER — Telehealth: Payer: Self-pay

## 2021-06-24 NOTE — Telephone Encounter (Signed)
Medication Samples have been provided to the patient.  Drug name: qulipta       Strength: 60mg         Qty: 4 boxes LOT:  Exp.Date: 10/2022  Dosing instructions: take one tablet daily   The patient has been instructed regarding the correct time, dose, and frequency of taking this medication, including desired effects and most common side effects.

## 2021-07-09 ENCOUNTER — Telehealth: Payer: Self-pay

## 2021-07-09 NOTE — Telephone Encounter (Signed)
Samples at front desk per pt request. Medication Samples have been provided to the patient.  Drug name: Bennie Pierini       Strength: 60        Qty: 3  LOT: [id number]  Exp.Date: 11/23  Dosing instructions:   The patient has been instructed regarding the correct time, dose, and frequency of taking this medication, including desired effects and most common side effects.   Leida Lauth 10:24 AM 07/09/2021

## 2021-07-11 NOTE — Telephone Encounter (Signed)
Resent forms to [place] [facility] as well.

## 2021-07-16 ENCOUNTER — Ambulatory Visit (INDEPENDENT_AMBULATORY_CARE_PROVIDER_SITE_OTHER): Payer: BC Managed Care – PPO | Admitting: Obstetrics and Gynecology

## 2021-07-16 ENCOUNTER — Other Ambulatory Visit (HOSPITAL_COMMUNITY)
Admission: RE | Admit: 2021-07-16 | Discharge: 2021-07-16 | Disposition: A | Discharge status: Home / Self Care | Payer: BC Managed Care – PPO | Source: Ambulatory Visit | Attending: Obstetrics and Gynecology | Admitting: Obstetrics and Gynecology

## 2021-07-16 ENCOUNTER — Other Ambulatory Visit: Payer: Self-pay

## 2021-07-16 DIAGNOSIS — B9689 Other specified bacterial agents as the cause of diseases classified elsewhere: Secondary | ICD-10-CM | POA: Insufficient documentation

## 2021-07-16 DIAGNOSIS — N949 Unspecified condition associated with female genital organs and menstrual cycle: Secondary | ICD-10-CM | POA: Insufficient documentation

## 2021-07-16 DIAGNOSIS — Z113 Encounter for screening for infections with a predominantly sexual mode of transmission: Secondary | ICD-10-CM | POA: Insufficient documentation

## 2021-07-16 DIAGNOSIS — N898 Other specified noninflammatory disorders of vagina: Secondary | ICD-10-CM

## 2021-07-16 DIAGNOSIS — B373 Candidiasis of vulva and vagina: Secondary | ICD-10-CM | POA: Insufficient documentation

## 2021-07-16 DIAGNOSIS — N760 Acute vaginitis: Secondary | ICD-10-CM | POA: Diagnosis not present

## 2021-07-16 MED ORDER — CEPHALEXIN 500 MG PO CAPS: 500.0000 mg | ORAL_CAPSULE | Freq: Four times a day (QID) | ORAL | 0 refills | Status: AC

## 2021-07-16 NOTE — Progress Notes (Signed)
  CC: genital lesion Subjective:    Patient ID: Autumn Cortez, female    DOB: 04-22-2001, 20 y.o.   MRN: [id number]  HPI 20 yo G0.  MD asked to see patient by nursing staff due to possible genital lesions.  There was concern for possible HSV infection.  Talked to patient who did note occasional lack of condom use, but no visual signs of infection from her solo partner.  She describes the lesions as "itchy."   Review of Systems     Objective:   Physical Exam Ext. Genitalia:  recently close shaved vulva and mons pubis.  4 discrete lesions which were mildly erythematous on mons and bilateral vulva.  Lesions appeared more consistent with folliculitis than HSV infection.  HSV culture taken. No ulcers or vesicular lesions noted.  Vagina: normal discharge, vaginal swab taken       Assessment & Plan:   1. Genital lesion, female Lesions consistent with folliculitis.  Discussed possible transition to waxing instead of shaving. Rx for keflex and advised topical neosporin or other antibacterial agent Also advised more consistent condom use - Cervicovaginal ancillary only( [place]) - Herpes simplex virus culture - cephALEXin (KEFLEX) 500 MG capsule; Take 1 capsule (500 mg total) by mouth 4 (four) times daily for 5 days.  Dispense: 20 capsule; Refill: 0  2. Vaginal discharge Vaginal swab as previous  F/u prn    Warden Fillers, MD Faculty Attending, [facility]

## 2021-07-16 NOTE — Progress Notes (Signed)
SUBJECTIVE:  20 y.o. female complains of green, yellowish vaginal discharge, itching, sores and blisters  for 4 day(s). Denies abnormal vaginal bleeding or significant pelvic pain or fever. No UTI symptoms. Denies history of known exposure to STD.  No LMP recorded.  OBJECTIVE:  She appears well, afebrile. Urine dipstick: not done.  ASSESSMENT:  Vaginal Discharge  Vaginal Odor   PLAN:  GC, chlamydia, trichomonas, BVAG, CVAG probe and HSV cultures sent to lab. Treatment: To be determined once lab results are received ROV prn if symptoms persist or worsen.

## 2021-07-17 LAB — CERVICOVAGINAL ANCILLARY ONLY
Bacterial Vaginitis (gardnerella): POSITIVE — AB
Candida Glabrata: NEGATIVE
Candida Vaginitis: POSITIVE — AB
Chlamydia: NEGATIVE
Comment: NEGATIVE
Comment: NEGATIVE
Comment: NEGATIVE
Comment: NEGATIVE
Comment: NEGATIVE
Comment: NORMAL
Neisseria Gonorrhea: NEGATIVE
Trichomonas: NEGATIVE

## 2021-07-18 ENCOUNTER — Other Ambulatory Visit: Payer: Self-pay

## 2021-07-18 DIAGNOSIS — N760 Acute vaginitis: Secondary | ICD-10-CM

## 2021-07-18 DIAGNOSIS — B379 Candidiasis, unspecified: Secondary | ICD-10-CM

## 2021-07-18 DIAGNOSIS — B9689 Other specified bacterial agents as the cause of diseases classified elsewhere: Secondary | ICD-10-CM

## 2021-07-18 MED ORDER — FLUCONAZOLE 150 MG PO TABS: 150.0000 mg | ORAL_TABLET | Freq: Once | ORAL | 0 refills | Status: DC

## 2021-07-18 MED ORDER — METRONIDAZOLE 500 MG PO TABS: 500.0000 mg | ORAL_TABLET | Freq: Two times a day (BID) | ORAL | 0 refills | Status: DC

## 2021-07-19 ENCOUNTER — Encounter: Payer: Self-pay | Admitting: Family Medicine

## 2021-07-19 ENCOUNTER — Other Ambulatory Visit: Payer: Self-pay

## 2021-07-19 ENCOUNTER — Ambulatory Visit (INDEPENDENT_AMBULATORY_CARE_PROVIDER_SITE_OTHER): Payer: BC Managed Care – PPO | Admitting: Family Medicine

## 2021-07-19 VITALS — BP 118/68 | HR 106 | Temp 97.5°F | Ht 61.5 in | Wt 99.2 lb

## 2021-07-19 DIAGNOSIS — W57XXXA Bitten or stung by nonvenomous insect and other nonvenomous arthropods, initial encounter: Secondary | ICD-10-CM | POA: Diagnosis not present

## 2021-07-19 DIAGNOSIS — L299 Pruritus, unspecified: Secondary | ICD-10-CM

## 2021-07-19 DIAGNOSIS — S90561A Insect bite (nonvenomous), right ankle, initial encounter: Secondary | ICD-10-CM

## 2021-07-19 LAB — HERPES SIMPLEX VIRUS CULTURE

## 2021-07-19 MED ORDER — TRIAMCINOLONE ACETONIDE 0.1 % EX CREA: 1.0000 "application " | TOPICAL_CREAM | Freq: Two times a day (BID) | CUTANEOUS | 0 refills | Status: DC

## 2021-07-19 NOTE — Progress Notes (Signed)
Chief Complaint  Patient presents with   Insect Bite    Autumn Cortez is a 20 y.o. female here for a skin complaint.  Duration: 5 days; started hurting a few d ago. Location: R ankle Pruritic? No Painful? Yes Drainage? No New soaps/lotions/topicals/detergents? No Sick contacts? No Other associated symptoms: swelling at night and painful to walk; no fevers or spreading Therapies tried thus far: ice  Past Medical History:  Diagnosis Date   ADD (attention deficit disorder with hyperactivity)    Anxiety    Asthma    Bipolar 1 disorder (HCC)    Migraines     BP 118/68   Pulse (!) 106   Temp (!) 97.5 F (36.4 C)   Ht 5' 1.5" (1.562 m)   Wt 99 lb 4 oz (45 kg)   SpO2 97%   BMI 18.45 kg/m  Gen: awake, alert, appearing stated age Lungs: No accessory muscle use Skin: R ankle/distal leg there ar e several areas of excoriation without drainage, erythema, TTP, fluctuance Psych: Age appropriate judgment and insight  Insect bite of right ankle, initial encounter - Plan: triamcinolone cream (KENALOG) 0.1 %  Pruritic condition - Plan: triamcinolone cream (KENALOG) 0.1 %  BID Kenalog cream, try not to itch, ice/cold, elevate, TAO bid over open lesions once she finishes abx. No scented products while she is dealing with this. Consider PO antihist.  F/u prn. The patient voiced understanding and agreement to the plan.  Jilda Roche [place], DO 07/19/21 11:47 AM

## 2021-07-19 NOTE — Patient Instructions (Addendum)
Ice/cold pack over area for 10-15 min twice daily.  Try not to scratch.  Avoid scented products while you are having itching.  When you do wash it, use only soap and water. Do not vigorously scrub. Apply triple antibiotic ointment (like Neosporin) twice daily once you finish your antibiotics from the gynecologist. Keep the area clean and dry.   Things to look out for: increasing pain not relieved by ibuprofen/acetaminophen, fevers, spreading redness, drainage of pus, or foul odor.  Let us know if you need anything.

## 2021-07-25 ENCOUNTER — Telehealth: Payer: Self-pay

## 2021-07-25 NOTE — Progress Notes (Signed)
Cheryll Cockayne Hoffmann (Key: [id number]) [phone] Qulipta 60MG  tablets     Status: PA Request  Created: July 28th, 2022  Sent: July 28th, 2022

## 2021-07-25 NOTE — Telephone Encounter (Signed)
Advised pt she should be able to get medication in the meantime while we start a PA through [facility].

## 2021-07-26 NOTE — Progress Notes (Signed)
Autumn Cortez (Key: [id number])  [employer] is reviewing your PA request. Typically an electronic response will be received within 24-72 hours. To check for an update later, open this request from your dashboard.  You may close this dialog and return to your dashboard to perform other tasks.

## 2021-07-26 NOTE — Progress Notes (Signed)
Restarted PA- Attach the letter of denial from primary insurance.   Waiting on response.

## 2021-07-26 NOTE — Progress Notes (Signed)
Per Letter please send a copy of denial letter from primary insurance to prove that the primary insurance will not cover medication.

## 2021-08-20 NOTE — Progress Notes (Signed)
Sent to qulipta, patient to contact them.

## 2021-09-13 ENCOUNTER — Ambulatory Visit: Payer: BC Managed Care – PPO | Admitting: Family Medicine

## 2021-09-18 ENCOUNTER — Encounter: Payer: Self-pay | Admitting: Family Medicine

## 2021-09-18 ENCOUNTER — Ambulatory Visit (INDEPENDENT_AMBULATORY_CARE_PROVIDER_SITE_OTHER): Payer: PRIVATE HEALTH INSURANCE | Admitting: Family Medicine

## 2021-09-18 ENCOUNTER — Other Ambulatory Visit: Payer: Self-pay

## 2021-09-18 VITALS — BP 112/78 | HR 88 | Temp 98.2°F | Ht 61.0 in | Wt 107.0 lb

## 2021-09-18 DIAGNOSIS — J4530 Mild persistent asthma, uncomplicated: Secondary | ICD-10-CM | POA: Diagnosis not present

## 2021-09-18 DIAGNOSIS — F319 Bipolar disorder, unspecified: Secondary | ICD-10-CM | POA: Diagnosis not present

## 2021-09-18 DIAGNOSIS — F900 Attention-deficit hyperactivity disorder, predominantly inattentive type: Secondary | ICD-10-CM

## 2021-09-18 DIAGNOSIS — F410 Panic disorder [episodic paroxysmal anxiety] without agoraphobia: Secondary | ICD-10-CM

## 2021-09-18 DIAGNOSIS — J0140 Acute pansinusitis, unspecified: Secondary | ICD-10-CM

## 2021-09-18 DIAGNOSIS — F411 Generalized anxiety disorder: Secondary | ICD-10-CM

## 2021-09-18 MED ORDER — AMPHETAMINE-DEXTROAMPHET ER 20 MG PO CP24: 20.0000 mg | ORAL_CAPSULE | Freq: Every day | ORAL | 0 refills | Status: DC

## 2021-09-18 MED ORDER — GABAPENTIN 300 MG PO CAPS: ORAL_CAPSULE | ORAL | 3 refills | Status: DC

## 2021-09-18 MED ORDER — AMOXICILLIN-POT CLAVULANATE 875-125 MG PO TABS: 1.0000 | ORAL_TABLET | Freq: Two times a day (BID) | ORAL | 0 refills | Status: AC

## 2021-09-18 MED ORDER — BUSPIRONE HCL 7.5 MG PO TABS: 7.5000 mg | ORAL_TABLET | Freq: Two times a day (BID) | ORAL | 2 refills | Status: DC

## 2021-09-18 MED ORDER — MONTELUKAST SODIUM 10 MG PO TABS: 10.0000 mg | ORAL_TABLET | Freq: Every day | ORAL | 3 refills | Status: DC

## 2021-09-18 NOTE — Patient Instructions (Addendum)
Please consider counseling. Contact [phone] to schedule an appointment or inquire about cost/insurance coverage.  Aim to do some physical exertion for 150 minutes per week. This is typically divided into 5 days per week, 30 minutes per day. The activity should be enough to get your heart rate up. Anything is better than nothing if you have time constraints.  Coping skills Choose 5 that work for you: Take a deep breath Count to 20 Read a book Do a puzzle Meditate Bake Sing Knit Garden Pray Go outside Call a friend Listen to music Take a walk Color Send a note Take a bath Watch a movie Be alone in a quiet place Pet an animal Visit a friend Journal Exercise Stretch   Let us know if you need anything.  [facility] [address] [place], [place] [address] [phone]  [facility] [address] [place], [place] [address] [phone]  [facility] [address] [place], [place] [address] [phone]  [facility] [address], [place], [place], [phone] [address], [place], [place], [phone]  [facility] [address] [place], [place] [address] [phone]  [facility] [address] [place], [place] [phone]  [facility] [address] [place], [place] [phone]  Call one of these offices sooner than later as it can take 2-3 months to get a new patient appointment.

## 2021-09-18 NOTE — Progress Notes (Signed)
Chief Complaint  Patient presents with   Follow-up   Cough    Sore throat for a couple weeks Congestion    Panic Attack    Autumn Cortez is a 20 y.o. female here for asthma. Here w dad.   Currently treated with Pulmicort 180 mcg 1 puff bid. Compliance is excellent. Remembers to rinse mouth out after use. Uses rescue inhaler 1 or fewer times per week. Reports breathing is good overall. No nighttime awakenings.  ADHD Hx of ADHD taking Adderall 20 mg XR daily. No AE's. Reports compliance. Takes most days. Uses it mainly for school. Feels it is working well.  GAD/BPD Pt was following w psych taking Seroquel 200 mg/d and Lamictal 100 mg bid. She is not following with a Veterinary surgeon. She does not work out. No SI or HI. No self medication.   Duration: 1 week  Associated symptoms: sinus congestion, sinus pain, rhinorrhea, itchy watery eyes, ear fullness, sore throat, chest tightness, and coughing Denies: itchy watery eyes, ear pain, ear drainage, wheezing, myalgia, and fevers Treatment to date: Allegra and INCS Sick contacts: No   Past Medical History:  Diagnosis Date   ADD (attention deficit disorder with hyperactivity)    Anxiety    Asthma    Bipolar 1 disorder (HCC)    Migraines     BP 112/78   Pulse 88   Temp 98.2 F (36.8 C) (Oral)   Ht 5\' 1"  (1.549 m)   Wt 107 lb (48.5 kg)   SpO2 99%   BMI 20.22 kg/m  Gen: Awake, alert HEENT: MMM, nares patent, no polyps, ears neg, mild ttp over frontal and max sinuses b/l Heart: RRR, no LE edema Lungs: CTAB, no accessory muscle use Msk: No clubbing or cyanosis Psych: Age appropriate judgment and insight  Mild persistent asthma without complication  ADHD (attention deficit hyperactivity disorder), inattentive type - Plan: amphetamine-dextroamphetamine (ADDERALL XR) 20 MG 24 hr capsule, amphetamine-dextroamphetamine (ADDERALL XR) 20 MG 24 hr capsule, amphetamine-dextroamphetamine (ADDERALL XR) 20 MG 24 hr capsule  Generalized  anxiety disorder with panic attacks - Plan: busPIRone (BUSPAR) 7.5 MG tablet, gabapentin (NEURONTIN) 300 MG capsule  Bipolar 1 disorder (HCC)  Acute pansinusitis, recurrence not specified - Plan: amoxicillin-clavulanate (AUGMENTIN) 875-125 MG tablet, montelukast (SINGULAIR) 10 MG tablet  Chronic, stable. Cont Pulmicort 180 mcg 1 puff bid. Cont SABA prn. Chronic, stable. Cont Adderall XR 20 mg/d.  Chronic/unstable. Add Buspar 7.5 mg bid. Gabapentin 300 mg tid prn. [facility] info provided. Counseled on exercise. Psychiatry resources provided. F/u in 1 mo. Chronic, seems stable. Cont Lamictal 100 mg bid, Seroquel 200 mg/d.  New. Likely 2/2 allergies. Add Singulair, Cont INCS, Allegra. In 3 d if no better, will start 7 d course of Augmentin.  F/u 1 mo. The patient and her father voiced understanding and agreement to the plan.  I spent 40 min w the patient discussing the above plan and reviewing her chart on the same day of visit.   [facility] [place], DO 09/18/21 8:42 AM

## 2021-10-18 ENCOUNTER — Other Ambulatory Visit: Payer: Self-pay

## 2021-10-18 ENCOUNTER — Ambulatory Visit (INDEPENDENT_AMBULATORY_CARE_PROVIDER_SITE_OTHER): Payer: PRIVATE HEALTH INSURANCE | Admitting: Family Medicine

## 2021-10-18 ENCOUNTER — Encounter: Payer: Self-pay | Admitting: Family Medicine

## 2021-10-18 DIAGNOSIS — F410 Panic disorder [episodic paroxysmal anxiety] without agoraphobia: Secondary | ICD-10-CM

## 2021-10-18 DIAGNOSIS — F411 Generalized anxiety disorder: Secondary | ICD-10-CM | POA: Diagnosis not present

## 2021-10-18 MED ORDER — BUSPIRONE HCL 7.5 MG PO TABS: 7.5000 mg | ORAL_TABLET | Freq: Two times a day (BID) | ORAL | 2 refills | Status: DC

## 2021-10-18 NOTE — Patient Instructions (Signed)
Keep the diet clean and stay active.  Aim to do some physical exertion for 150 minutes per week. This is typically divided into 5 days per week, 30 minutes per day. The activity should be enough to get your heart rate up. Anything is better than nothing if you have time constraints.  Let us know if you need anything. 

## 2021-10-18 NOTE — Progress Notes (Signed)
Chief Complaint  Patient presents with   ADHD   Follow-up    Subjective Autumn Cortez presents for f/u anxiety/depression. Here w dad.   Pt is currently being treated with Lamictal 100 mg bid, Seroquel 200 mg/d, BuSpar 7.5 mg bid.  Reports doing much better since treatment. No thoughts of harming self or others. No self-medication with alcohol, prescription drugs or illicit drugs. Pt is not following with a counselor/psychologist.  Past Medical History:  Diagnosis Date   ADD (attention deficit disorder with hyperactivity)    Anxiety    Asthma    Bipolar 1 disorder (HCC)    Migraines    Allergies as of 10/18/2021       Reactions   Pollen Extract Other (See Comments)   Seasonal allergies - runny nose, sneezing, watery eyes Congestion and sneezing        Medication List        Accurate as of October 18, 2021  1:02 PM. If you have any questions, ask your nurse or doctor.          albuterol 108 (90 Base) MCG/ACT inhaler Commonly known as: VENTOLIN HFA Inhale 2 puffs into the lungs every 6 (six) hours as needed for shortness of breath.   amphetamine-dextroamphetamine 20 MG 24 hr capsule Commonly known as: ADDERALL XR Take 1 capsule (20 mg total) by mouth daily.   amphetamine-dextroamphetamine 20 MG 24 hr capsule Commonly known as: Adderall XR Take 1 capsule (20 mg total) by mouth daily.   amphetamine-dextroamphetamine 20 MG 24 hr capsule Commonly known as: Adderall XR Take 1 capsule (20 mg total) by mouth daily. Start taking on: November 17, 2021   budesonide 180 MCG/ACT inhaler Commonly known as: PULMICORT Inhale 1 puff into the lungs 2 (two) times daily.   busPIRone 7.5 MG tablet Commonly known as: BUSPAR Take 1 tablet (7.5 mg total) by mouth 2 (two) times daily.   drospirenone-ethinyl estradiol 3-0.02 MG tablet Commonly known as: YAZ Take 1 tablet by mouth daily.   fluconazole 150 MG tablet Commonly known as: DIFLUCAN Take 1 tablet (150  mg total) by mouth once for 1 dose.   fluticasone 50 MCG/ACT nasal spray Commonly known as: FLONASE Place 2 sprays into both nostrils daily.   gabapentin 300 MG capsule Commonly known as: NEURONTIN Take 1-2 capsule 3 times daily as needed for panic attacks.   lamoTRIgine 100 MG tablet Commonly known as: LAMICTAL Take 100 mg by mouth 2 (two) times daily.   montelukast 10 MG tablet Commonly known as: SINGULAIR Take 1 tablet (10 mg total) by mouth at bedtime.   ondansetron 8 MG disintegrating tablet Commonly known as: Zofran ODT Take 1 tablet (8 mg total) by mouth every 8 (eight) hours as needed for nausea or vomiting.   QUEtiapine 200 MG tablet Commonly known as: SEROQUEL Take 1 tablet (200 mg total) by mouth at bedtime as needed (sleep).   Qulipta 60 MG Tabs Generic drug: Atogepant Take 60 mg by mouth daily.   rizatriptan 10 MG disintegrating tablet Commonly known as: Maxalt-MLT Take 1 tablet (10 mg total) by mouth as needed for migraine (May repeat in 2 hours.  Maximum 2 tablets in 24 hours). May repeat in 2 hours if needed   triamcinolone cream 0.1 % Commonly known as: KENALOG Apply 1 application topically 2 (two) times daily.        Exam BP 98/70 (BP Location: Left Arm, Patient Position: Sitting, Cuff Size: Normal)   Pulse (!) 102  Temp 98.2 F (36.8 C) (Oral)   Resp 16   Ht 5\' 2"  (1.575 m)   Wt 103 lb 9.6 oz (47 kg)   SpO2 98%   BMI 18.95 kg/m  General:  well developed, well nourished, in no apparent distress Heart: RRR Lungs:  CTAB. No respiratory distress Psych: well oriented with normal range of affect and age-appropriate judgement/insight, alert and oriented x4.  Assessment and Plan  Generalized anxiety disorder with panic attacks - Plan: busPIRone (BUSPAR) 7.5 MG tablet  Chronic, stable. Cont Lamictal 100 mg bid, Seroquel 200 mg/d, BuSpar 7.5 mg bid.  F/u in 6 mo for CPE or prn. The patient and her father voiced understanding and agreement to  the plan.  [place], DO 10/18/21 1:02 PM

## 2021-10-21 MED ORDER — LAMOTRIGINE 100 MG PO TABS
100.0000 mg | ORAL_TABLET | Freq: Two times a day (BID) | ORAL | 2 refills | Status: DC
Start: 2021-10-21 — End: 2022-01-27

## 2021-11-12 ENCOUNTER — Encounter: Payer: Self-pay | Admitting: Family Medicine

## 2021-11-12 ENCOUNTER — Telehealth (INDEPENDENT_AMBULATORY_CARE_PROVIDER_SITE_OTHER): Payer: PRIVATE HEALTH INSURANCE | Admitting: Family Medicine

## 2021-11-12 DIAGNOSIS — H9202 Otalgia, left ear: Secondary | ICD-10-CM

## 2021-11-12 MED ORDER — PREDNISONE 20 MG PO TABS
40.0000 mg | ORAL_TABLET | Freq: Every day | ORAL | 0 refills | Status: AC
Start: 2021-11-12 — End: 2021-11-17

## 2021-11-12 MED ORDER — TIZANIDINE HCL 4 MG PO TABS: 4.0000 mg | ORAL_TABLET | Freq: Four times a day (QID) | ORAL | 0 refills | Status: DC | PRN

## 2021-11-12 NOTE — Progress Notes (Signed)
Chief Complaint  Patient presents with   Ear Pain    Left ear    Pt is here for left ear pain. Due to COVID-19 pandemic, we are interacting via web portal for an electronic face-to-face visit. I verified patient's ID using 2 identifiers. Patient agreed to proceed with visit via this method. Patient is at home, I am at office. Patient and I are present for visit.   Duration: 1  week Progression: worse Associated symptoms: slight jaw pain that started a couple days ago Denies: sore throat, congestion, post nasal drip, coryza, sinus pressure, and fevers, bleeding, or discharge from ear Treatment to date: none  Past Medical History:  Diagnosis Date   ADD (attention deficit disorder with hyperactivity)    Anxiety    Asthma    Bipolar 1 disorder (HCC)    Migraines    Objective No conversational dyspnea Age appropriate judgment and insight Nml affect and mood  Left ear pain - Plan: predniSONE (DELTASONE) 20 MG tablet, tiZANidine (ZANAFLEX) 4 MG tablet  Probably ETD. 5 d pred burst 40 mg/d. Will send Zanaflex to cover for TMJ syndrome. Unlikely to be infection given lack of assoc s/s's and age.  F/u prn.  Pt voiced understanding and agreement to the plan.  [name], DO 11/12/21 3:33 PM

## 2021-12-02 NOTE — Progress Notes (Signed)
NEUROLOGY FOLLOW UP OFFICE NOTE  Autumn Cortez [id number]  Assessment/Plan:   Migraine without Aura, Without Status Migrainosus, Not Intractable  Migraine prevention:  Provide samples of Qulipta 60mg  daily until we figure out why she isn't able to receive the medication. Migraine rescue:  rizatriptan 10mg , Zofran 8mg  Limit use of pain relievers to no more than 2 days out of week to prevent risk of rebound or medication-overuse headache. Keep headache diary Follow up 8 months   Subjective:  Autumn Cortez is an 20 year old right-handed female with ADD, Bipolar 1 disorder and anxiety who follows up for migraines.    UPDATE in June.  However, she has not been taking it for 2 weeks because her pharmacy said the [place] wasn't covered, however, she has been getting it from the financial assistance program.  Nobody has been able to get a hold of of the company.  Prior to this, migraines were much improved. Intensity:  moderate Duration:  15 minutes with rizatriptan Frequency:  1 to 2 days a month Over the past week, she has had 2-3 migraines.  Frequency of abortive medication: 2-3 times a month Current NSAIDS/analgesics:  none Current triptans:  Tylenol Current ergotamine:  rizatriptan 10mg  Current anti-emetic:  Zofran ODT 8mg  Current muscle relaxants:  none Current Antihypertensive medications:  none Current Antidepressant medications:  none Current Anticonvulsant medications:  lamotrigine 100mg  BID Current anti-CGRP:  Qulipta (out of it for 2 weeks) Current Vitamins/Herbal/Supplements:  none Current Antihistamines/Decongestants:  Flonase Other therapy:  none Hormone/birth control:  Yaz Other medications:  Seroquel, Adderall XR, buspirone  Caffeine:  Used to drink coffee daily.  Now drinks coffee once a month.  Does not drink soda often Diet:  A little over 32 oz water daily.  Rarely soda.  Sometimes skips meals Exercise:  no Depression:  no;  Anxiety:  no Other pain:  no Sleep:  Improved.  Takes quetiapine to help sleep (as well as for her Bipolar)  HISTORY:  Onset:  20 years old Location:  starts across frontal region and migrates to entire head Quality:  pounding Initial Intensity:  Moderate to severe Aura:  none Prodrome:  none Associated symptoms:  Nausea, vomiting, photophobia.  She denies associated vomiting, phonophobia, visual disturbance, unilateral numbness or weakness. Initial Duration:  Usually starts in evening and will need to fall asleep and gone when she wakes up.  Otherwise, may occur all day Initial Frequency:  Once a week a severe migraine, two days a week a headache without nausea Initial Frequency of abortive medication: Takes Tylenol about 2 days a week Triggers:  unknown Relieving factors:  sleep Activity:  Movement aggravates    Past NSAIDS/analgesics:  Ibuprofen, naproxen, Excedrin, meloxicam Past abortive triptans:  none Past abortive ergotamine:  none Past muscle relaxants:  Flexeril Past anti-emetic:  none Past antihypertensive medications:  propranolol Past antidepressant medications:  Venlafaxine, citalopram Past anticonvulsant medications:  topiramate Past anti-CGRP:  none Past vitamins/Herbal/Supplements:  none Past antihistamines/decongestants:  none Other past therapies:  Cold rag/ice pack on head or behind neck, lay down with fan on    Family history of headache:  Mother, uncle, maternal great grandmother  PAST MEDICAL HISTORY: Past Medical History:  Diagnosis Date   ADD (attention deficit disorder with hyperactivity)    Anxiety    Asthma    Bipolar 1 disorder (HCC)    Migraines     MEDICATIONS: Current Outpatient Medications on File Prior to Visit  Medication Sig Dispense Refill  albuterol (VENTOLIN HFA) 108 (90 Base) MCG/ACT inhaler Inhale 2 puffs into the lungs every 6 (six) hours as needed for shortness of breath. 18 g 3   amphetamine-dextroamphetamine (ADDERALL XR)  20 MG 24 hr capsule Take 1 capsule (20 mg total) by mouth daily. 30 capsule 0   amphetamine-dextroamphetamine (ADDERALL XR) 20 MG 24 hr capsule Take 1 capsule (20 mg total) by mouth daily. 30 capsule 0   amphetamine-dextroamphetamine (ADDERALL XR) 20 MG 24 hr capsule Take 1 capsule (20 mg total) by mouth daily. 30 capsule 0   Atogepant (QULIPTA) 60 MG TABS Take 60 mg by mouth daily. 30 tablet 5   budesonide (PULMICORT) 180 MCG/ACT inhaler Inhale 1 puff into the lungs 2 (two) times daily. 3 each 2   busPIRone (BUSPAR) 7.5 MG tablet Take 1 tablet (7.5 mg total) by mouth 2 (two) times daily. 180 tablet 2   drospirenone-ethinyl estradiol (YAZ) 3-0.02 MG tablet Take 1 tablet by mouth daily. 84 tablet 3   fluconazole (DIFLUCAN) 150 MG tablet Take 1 tablet (150 mg total) by mouth once for 1 dose. 1 tablet 0   fluticasone (FLONASE) 50 MCG/ACT nasal spray Place 2 sprays into both nostrils daily. 16 g 1   gabapentin (NEURONTIN) 300 MG capsule Take 1-2 capsule 3 times daily as needed for panic attacks. 30 capsule 3   lamoTRIgine (LAMICTAL) 100 MG tablet Take 1 tablet (100 mg total) by mouth 2 (two) times daily. 60 tablet 2   montelukast (SINGULAIR) 10 MG tablet Take 1 tablet (10 mg total) by mouth at bedtime. 30 tablet 3   ondansetron (ZOFRAN ODT) 8 MG disintegrating tablet Take 1 tablet (8 mg total) by mouth every 8 (eight) hours as needed for nausea or vomiting. 20 tablet 5   QUEtiapine (SEROQUEL) 200 MG tablet Take 1 tablet (200 mg total) by mouth at bedtime as needed (sleep). 30 tablet 0   rizatriptan (MAXALT-MLT) 10 MG disintegrating tablet Take 1 tablet (10 mg total) by mouth as needed for migraine (May repeat in 2 hours.  Maximum 2 tablets in 24 hours). May repeat in 2 hours if needed 10 tablet 5   tiZANidine (ZANAFLEX) 4 MG tablet Take 1 tablet (4 mg total) by mouth every 6 (six) hours as needed for muscle spasms. 20 tablet 0   triamcinolone cream (KENALOG) 0.1 % Apply 1 application topically 2 (two)  times daily. 30 g 0   No current facility-administered medications on file prior to visit.    ALLERGIES: Allergies  Allergen Reactions   Pollen Extract Other (See Comments)    Seasonal allergies - runny nose, sneezing, watery eyes Congestion and sneezing    FAMILY HISTORY: Family History  Problem Relation Age of Onset   Hypertension Mother    Migraines Mother    Asthma Mother    Heart disease Mother    Cancer Maternal Grandmother    Diabetes Paternal Grandfather    Hypertension Paternal Grandfather    Heart disease Paternal Grandfather    Post-traumatic stress disorder Father    Bipolar disorder Father       Objective:  Blood pressure 118/76, pulse 91, height 5\' 2"  (1.575 m), weight 110 lb 12.8 oz (50.3 kg), SpO2 98 %. General: No acute distress.  Patient appears well-groomed.   Heart:  RRR Lungs:  CTAB Neurological:  alert and oriented to person, place, and time.  Speech fluent and not dysarthric, language intact.  CN II-XII intact. Bulk and tone normal, muscle strength 5/5 throughout.  Sensation  to light touch intact.  Deep tendon reflexes 2+ throughout.  Finger to nose testing intact.  Gait normal, Romberg negative.    Shon Millet, DO  CC: Arva Chafe, DO

## 2021-12-03 ENCOUNTER — Encounter: Payer: Self-pay | Admitting: Neurology

## 2021-12-03 ENCOUNTER — Ambulatory Visit (INDEPENDENT_AMBULATORY_CARE_PROVIDER_SITE_OTHER): Payer: PRIVATE HEALTH INSURANCE | Admitting: Neurology

## 2021-12-03 ENCOUNTER — Other Ambulatory Visit: Payer: Self-pay

## 2021-12-03 VITALS — BP 118/76 | HR 91 | Ht 62.0 in | Wt 110.8 lb

## 2021-12-03 DIAGNOSIS — G43009 Migraine without aura, not intractable, without status migrainosus: Secondary | ICD-10-CM | POA: Diagnosis not present

## 2021-12-03 NOTE — Patient Instructions (Signed)
  Will figure out what is going on about Qulipta.  Start the samples until then (1 tablet daily Take rizatriptan 10mg  at earliest onset of headache.  May repeat dose once in 2 hours if needed.  Maximum 2 tablets in 24 hours. Ondansetron for nausea Limit use of pain relievers to no more than 2 days out of the week.  These medications include acetaminophen, NSAIDs (ibuprofen/Advil/Motrin, naproxen/Aleve, triptans (Imitrex/sumatriptan), Excedrin, and narcotics.  This will help reduce risk of rebound headaches. Be aware of common food triggers:  - Caffeine:  coffee, black tea, cola, Mt. Dew  - Chocolate  - Dairy:  aged cheeses (brie, blue, cheddar, gouda, [place], provolone, [place], Swiss, etc), chocolate milk, buttermilk, sour cream, limit eggs and yogurt  - Nuts, peanut butter  - Alcohol  - Cereals/grains:  FRESH breads (fresh bagels, sourdough, doughnuts), yeast productions  - Processed/canned/aged/cured meats (pre-packaged deli meats, hotdogs)  - MSG/glutamate:  soy sauce, flavor enhancer, pickled/preserved/marinated foods  - Sweeteners:  aspartame (Equal, Nutrasweet).  Sugar and Splenda are okay  - Vegetables:  legumes (lima beans, lentils, snow peas, fava beans, pinto peans, peas, garbanzo beans), sauerkraut, onions, olives, pickles  - Fruit:  avocados, bananas, citrus fruit (orange, lemon, grapefruit), mango  - Other:  Frozen meals, macaroni and cheese Routine exercise Stay adequately hydrated (aim for 64 oz water daily) Keep headache diary Maintain proper stress management Maintain proper sleep hygiene Do not skip meals Consider supplements:  magnesium citrate 400mg  daily, riboflavin 400mg  daily, coenzyme Q10 100mg  three times daily. Follow up 8 months

## 2021-12-04 ENCOUNTER — Telehealth: Payer: Self-pay | Admitting: Family Medicine

## 2021-12-04 ENCOUNTER — Encounter: Payer: Self-pay | Admitting: Family Medicine

## 2021-12-04 ENCOUNTER — Ambulatory Visit (INDEPENDENT_AMBULATORY_CARE_PROVIDER_SITE_OTHER): Payer: PRIVATE HEALTH INSURANCE | Admitting: Family Medicine

## 2021-12-04 VITALS — BP 108/72 | HR 93 | Temp 98.2°F | Ht 62.0 in | Wt 107.1 lb

## 2021-12-04 DIAGNOSIS — H6982 Other specified disorders of Eustachian tube, left ear: Secondary | ICD-10-CM | POA: Diagnosis not present

## 2021-12-04 DIAGNOSIS — J029 Acute pharyngitis, unspecified: Secondary | ICD-10-CM | POA: Diagnosis not present

## 2021-12-04 LAB — POC COVID19 BINAXNOW: SARS Coronavirus 2 Ag: NEGATIVE

## 2021-12-04 LAB — POCT RAPID STREP A (OFFICE): Rapid Strep A Screen: NEGATIVE

## 2021-12-04 MED ORDER — PREDNISONE 20 MG PO TABS
40.0000 mg | ORAL_TABLET | Freq: Every day | ORAL | 0 refills | Status: AC
Start: 2021-12-04 — End: 2021-12-09

## 2021-12-04 NOTE — Progress Notes (Signed)
SUBJECTIVE:   Autumn Cortez is a 20 y.o. female presents to the clinic for:  Chief Complaint  Patient presents with   Sore Throat   Cough    Congestion     Complains of sore throat for 3 days.  Other associated symptoms: sinus headache, sinus congestion, rhinorrhea, itchy watery eyes, ear pain, sore throat, chest pain, myalgia, and coughing .  Denies: sinus pain, itchy watery eyes, ear drainage, wheezing, shortness of breath, and fevers, N/v/D Sick Contacts: none known Therapy to date:  [facility]  Social History   Tobacco Use  Smoking Status Every Day   Types: E-cigarettes  Smokeless Tobacco Never    Patient's medications, allergies, past medical, surgical, social and family histories were reviewed and updated as appropriate.  OBJECTIVE:  BP 108/72   Pulse 93   Temp 98.2 F (36.8 C) (Oral)   Ht 5\' 2"  (1.575 m)   Wt 107 lb 2 oz (48.6 kg)   SpO2 99%   BMI 19.59 kg/m  General: Awake, alert, appearing stated age Eyes: conjunctivae and sclerae clear Ears: normal TMs bilaterally, 80% obstructing wax b/l Nose: no visible d/c Oropharynx:  pharynx w cobblestoning erythema, no exudates, tonsillar pillars pink Neck: supple, no significant adenopathy Lungs: clear to auscultation, no wheezes, rales or rhonchi, symmetric air entry, normal effort Heart: RRR Skin:reveals no rash Psych: Age appropriate judgment and insight  ASSESSMENT/PLAN:  Dysfunction of left eustachian tube - Plan: predniSONE (DELTASONE) 20 MG tablet  Sore throat - Plan: predniSONE (DELTASONE) 20 MG tablet  Continue to practice good hand hygiene and push fluids. Acetaminophen for pain. Strep and covid testing neg. Supportive care in addition to 5 d pred burst.  F/u prn. Pt voiced understanding and agreement to the plan.  [place], DO 12/04/21 2:34 PM

## 2021-12-04 NOTE — Addendum Note (Signed)
Addended by: Scharlene Gloss B on: 12/04/2021 02:36 PM   Modules accepted: Orders

## 2021-12-04 NOTE — Patient Instructions (Signed)
OK to take Tylenol 1000 mg (2 extra strength tabs) or 975 mg (3 regular strength tabs) every 6 hours as needed.  Consider throat lozenges, salt water gargles and an air humidifier for symptomatic care.   Let us know if you need anything.

## 2021-12-04 NOTE — Telephone Encounter (Signed)
Patient's mother called to make an appointment for her daughter due to her daughter having blisters on her throat. She was informed that Dr. Carmelia Roller was booked for the day but I would check if another provider was available. Her mom stated "they usually can work her in so can you ask". CMA was messaged and she confirmed that Dr. Carmelia Roller was booked and would not be able to fit her in. Mother was informed that Carmelia Roller could not see her, but we could get her seen via a virtual appointment with Ramon Dredge since all of ours providers were booked in the office. Mother was very upset stating that how was it possible that we did not have any same day available appointments, and how was her daughter going to be tested which is what she needs through a virtual appointment. I informed the mother that she would still be able to get medication through a a virtual appointment but if she preferred we had openings tomorrow in person. She stated that it was not ok, and that she would message the CMA and our supervisors because we were "pissing her off" and we were not doing our job. She was also upset that we are open at 7am and our phones don't start off until 8am. I apologized for the inconvenience and tried to explain that due to the past holiday our providers have been booked pretty quick to where she started getting more upset and yelling "you are not sorry, not you're not" and not letting me speak. Once she calmed down, I asked if I could help with anything else and she stated "no, goodbye" and proceeded to hang up.

## 2021-12-05 ENCOUNTER — Encounter: Payer: Self-pay | Admitting: Family Medicine

## 2021-12-05 NOTE — Telephone Encounter (Signed)
Do you want me to schedule patient for NV to get throat culture?

## 2021-12-06 ENCOUNTER — Other Ambulatory Visit: Payer: Self-pay | Admitting: Family Medicine

## 2021-12-06 ENCOUNTER — Ambulatory Visit (INDEPENDENT_AMBULATORY_CARE_PROVIDER_SITE_OTHER): Payer: PRIVATE HEALTH INSURANCE | Admitting: *Deleted

## 2021-12-06 ENCOUNTER — Other Ambulatory Visit: Payer: PRIVATE HEALTH INSURANCE

## 2021-12-06 DIAGNOSIS — J029 Acute pharyngitis, unspecified: Secondary | ICD-10-CM | POA: Diagnosis not present

## 2021-12-06 DIAGNOSIS — R509 Fever, unspecified: Secondary | ICD-10-CM

## 2021-12-06 LAB — POCT INFLUENZA A/B
Influenza A, POC: POSITIVE — AB
Influenza B, POC: NEGATIVE

## 2021-12-06 MED ORDER — HYDROCODONE BIT-HOMATROP MBR 5-1.5 MG/5ML PO SOLN: 5.0000 mL | Freq: Three times a day (TID) | ORAL | 0 refills | Status: DC | PRN

## 2021-12-06 NOTE — Progress Notes (Signed)
Patient here for throat culture per physicians order.  Throat culture obtained.  Patient has also wanted to be checked for flu test.  Verbal ordered obtain from Dr. Carmelia Roller.  Flu test positive for Flu A.  Patient was in office with father who had visit with Dr. Carmelia Roller and results given at that time.

## 2021-12-08 LAB — CULTURE, GROUP A STREP
MICRO NUMBER:: 12737654
SPECIMEN QUALITY:: ADEQUATE

## 2021-12-09 ENCOUNTER — Other Ambulatory Visit: Payer: Self-pay | Admitting: Family Medicine

## 2021-12-09 MED ORDER — AZITHROMYCIN 250 MG PO TABS: ORAL_TABLET | ORAL | 0 refills | Status: DC

## 2022-01-06 ENCOUNTER — Other Ambulatory Visit: Payer: Self-pay | Admitting: Family Medicine

## 2022-01-06 DIAGNOSIS — F900 Attention-deficit hyperactivity disorder, predominantly inattentive type: Secondary | ICD-10-CM

## 2022-01-06 MED ORDER — AMPHETAMINE-DEXTROAMPHET ER 20 MG PO CP24: 20.0000 mg | ORAL_CAPSULE | Freq: Every day | ORAL | 0 refills | Status: DC

## 2022-01-06 NOTE — Telephone Encounter (Signed)
Last OV--12/04/2021 Last RF--11/17/2021

## 2022-01-12 ENCOUNTER — Other Ambulatory Visit: Payer: Self-pay | Admitting: Family Medicine

## 2022-01-12 DIAGNOSIS — J0140 Acute pansinusitis, unspecified: Secondary | ICD-10-CM

## 2022-01-21 ENCOUNTER — Ambulatory Visit (INDEPENDENT_AMBULATORY_CARE_PROVIDER_SITE_OTHER): Payer: PRIVATE HEALTH INSURANCE | Admitting: Family Medicine

## 2022-01-21 ENCOUNTER — Encounter: Payer: Self-pay | Admitting: Family Medicine

## 2022-01-21 VITALS — BP 120/80 | HR 95 | Temp 98.0°F | Ht 62.0 in | Wt 106.5 lb

## 2022-01-21 DIAGNOSIS — N760 Acute vaginitis: Secondary | ICD-10-CM

## 2022-01-21 MED ORDER — TRIAMCINOLONE ACETONIDE 0.1 % EX CREA: 1.0000 "application " | TOPICAL_CREAM | Freq: Two times a day (BID) | CUTANEOUS | 0 refills | Status: DC

## 2022-01-21 NOTE — Progress Notes (Signed)
Chief Complaint  Patient presents with   Vaginitis    Vaginal odor/itching and worse at night    Autumn Cortez is a 21 y.o. female here for vaginal irritation.  Duration: 1 week Skin lesion outside that is burning and itching.  Denies d/c.  Odor: Yes New sexual partner: No Urinary complaints: No Denies fevers, bleeding, pregnancy abdominal pain.  Past Medical History:  Diagnosis Date   ADD (attention deficit disorder with hyperactivity)    Anxiety    Asthma    Bipolar 1 disorder ([place])    Migraines    Family History  Problem Relation Age of Onset   Hypertension Mother    Migraines Mother    Asthma Mother    Heart disease Mother    Cancer Maternal Grandmother    Diabetes Paternal Grandfather    Hypertension Paternal Grandfather    Heart disease Paternal Grandfather    Post-traumatic stress disorder Father    Bipolar disorder Father     BP 120/80    Pulse 95    Temp 98 F (36.7 C) (Oral)    Ht 5\' 2"  (1.575 m)    Wt 106 lb 8 oz (48.3 kg)    SpO2 99%    BMI 19.48 kg/m  Gen: Awake, alert, appears stated age Heart: RRR Lungs: CTAB, no accessory muscle use Abd: BS+, soft, NT, ND, no masses or organomegaly GU: Examined in presence of female chaperone; slightly erythematous between labia b/l. No drainage, scaling, bleeding, excessive warmth, fluctuance Psych: Age appropriate judgment and insight, nml mood and affect  Vulvovaginitis - Plan: triamcinolone cream (KENALOG) 0.1 %  Avoid itching and scented products.  F/u prn in 7-10 d if no better.  Pt voiced understanding and agreement to the plan.  [place], DO 01/21/22 3:45 PM

## 2022-01-21 NOTE — Patient Instructions (Signed)
Try not to scratch as this can make things worse. Avoid scented products while dealing with this. You may resume when the itchiness resolves. Cold/cool compresses can help.   If no improvement in the next 7-10 days, let me know.   Let us know if you need anything.

## 2022-01-24 ENCOUNTER — Telehealth: Payer: Self-pay | Admitting: Family Medicine

## 2022-01-24 NOTE — Telephone Encounter (Signed)
Pt mom made an appointment via mychart for "short term memory loss, nightmares, and sleep issues" contacted pt and mom answered, pt's mom informed me that pt would forgot things periodically, transferred to triage regarding symps.

## 2022-01-24 NOTE — Telephone Encounter (Signed)
Scheduled Monday 01/27/2022 at 10:15.  Mom is concerned over sleep problems, memory loss and nightmares. Will discuss with PCP on Monday 01/27/2022 at appt.

## 2022-01-27 ENCOUNTER — Encounter: Payer: Self-pay | Admitting: Family Medicine

## 2022-01-27 ENCOUNTER — Other Ambulatory Visit: Payer: Self-pay | Admitting: Family Medicine

## 2022-01-27 ENCOUNTER — Ambulatory Visit (INDEPENDENT_AMBULATORY_CARE_PROVIDER_SITE_OTHER): Payer: PRIVATE HEALTH INSURANCE | Admitting: Family Medicine

## 2022-01-27 ENCOUNTER — Telehealth: Payer: Self-pay | Admitting: Neurology

## 2022-01-27 VITALS — BP 118/83 | HR 98 | Temp 98.3°F | Ht 62.0 in | Wt 108.5 lb

## 2022-01-27 DIAGNOSIS — F319 Bipolar disorder, unspecified: Secondary | ICD-10-CM | POA: Diagnosis not present

## 2022-01-27 DIAGNOSIS — R413 Other amnesia: Secondary | ICD-10-CM | POA: Diagnosis not present

## 2022-01-27 LAB — CBC
HCT: 42.6 % (ref 36.0–49.0)
Hemoglobin: 14 g/dL (ref 12.0–16.0)
MCHC: 32.9 g/dL (ref 31.0–37.0)
MCV: 88.5 fl (ref 78.0–98.0)
Platelets: 250 10*3/uL (ref 150.0–575.0)
RBC: 4.81 Mil/uL (ref 3.80–5.70)
RDW: 12.9 % (ref 11.4–15.5)
WBC: 8.1 10*3/uL (ref 4.5–13.5)

## 2022-01-27 LAB — COMPREHENSIVE METABOLIC PANEL
ALT: 8 U/L (ref 0–35)
AST: 13 U/L (ref 0–37)
Albumin: 4 g/dL (ref 3.5–5.2)
Alkaline Phosphatase: 55 U/L (ref 47–119)
BUN: 10 mg/dL (ref 6–23)
CO2: 26 mEq/L (ref 19–32)
Calcium: 9.4 mg/dL (ref 8.4–10.5)
Chloride: 104 mEq/L (ref 96–112)
Creatinine, Ser: 0.77 mg/dL (ref 0.40–1.20)
GFR: 111.64 mL/min (ref >60.00)
Glucose, Bld: 96 mg/dL (ref 70–99)
Potassium: 4.1 mEq/L (ref 3.5–5.1)
Sodium: 140 mEq/L (ref 135–145)
Total Bilirubin: 0.8 mg/dL (ref 0.2–1.2)
Total Protein: 6.4 g/dL (ref 6.0–8.3)

## 2022-01-27 LAB — VITAMIN B12: Vitamin B-12: 182 pg/mL — ABNORMAL LOW (ref 211–911)

## 2022-01-27 LAB — TSH: TSH: 1.83 u[IU]/mL (ref 0.40–5.00)

## 2022-01-27 MED ORDER — LAMOTRIGINE 100 MG PO TABS: 100.0000 mg | ORAL_TABLET | Freq: Two times a day (BID) | ORAL | 2 refills | Status: DC

## 2022-01-27 NOTE — Progress Notes (Signed)
Chief Complaint  Patient presents with   Insomnia    Memory loss Night mares    Subjective: Patient is a 21 y.o. female here for memory issue. Here w mom.   4 d ago, went to evening class and turned off phone/watch. Has a patch of time where she has no recollection. No fevers, recent illness, sleep deprivation, hx of hallucination, med changes, illicit drug use, alcohol use, trauma to head, change in diet/PO intake, previous episode with this, or changes in mood. Associated nightmares over past 1.5 weeks. Sees Neuro for migraines.   Past Medical History:  Diagnosis Date   ADD (attention deficit disorder with hyperactivity)    Anxiety    Asthma    Bipolar 1 disorder (HCC)    Migraines     Objective: BP 118/83    Pulse 98    Temp 98.3 F (36.8 C) (Oral)    Ht 5\' 2"  (1.575 m)    Wt 108 lb 8 oz (49.2 kg)    SpO2 99%    BMI 19.84 kg/m  General: Awake, appears stated age Heart: RRR, no LE edema Lungs: CTAB, no rales, wheezes or rhonchi. No accessory muscle use Neuro: DTR's equal and symmetric throughout, no clonus, no cerebellar signs. HEENT: PERRLA Psych: Age appropriate judgment and insight, normal affect and mood  Assessment and Plan: Amnesia - Plan: CBC, Comprehensive metabolic panel, TSH, [id number]  Bipolar 1 disorder ([place]), Chronic  New problem, uncertain prog. Doubt manic state. Neuro exam neg. Ck labs. She will reach out to her neurologist. Would prefer them to order imaging. Offered med for nightmares, politely declined.  The patient and her mom voiced understanding and agreement to the plan.  [place], DO 01/27/22  12:01 PM

## 2022-01-27 NOTE — Telephone Encounter (Signed)
Patient was seen at PCP today and they wanted her to come and see you about the problem amnesia that she was seen for today in their office. The patient mom also mentioned that they wanted you to order some imaging  to be done or have any thoughts about what to order.  Rinaldo Cloud schedule patient with you on 03-26-22 for a follow from the PCP visit today she also kept the appt that the patient already had with you on -07-04-22    Please call patient mother

## 2022-01-27 NOTE — Patient Instructions (Signed)
Reach out to your neurologist to see if they can get you in soon. If they cannot, please ask if they have a recommendation for imaging in the meanwhile.   Give [place] 2-3 business days to get the results of your labs back.   Stay hydrated.  Let us know if you need anything.

## 2022-01-28 ENCOUNTER — Other Ambulatory Visit: Payer: PRIVATE HEALTH INSURANCE

## 2022-01-28 DIAGNOSIS — R413 Other amnesia: Secondary | ICD-10-CM

## 2022-01-29 ENCOUNTER — Ambulatory Visit: Payer: PRIVATE HEALTH INSURANCE | Admitting: Family Medicine

## 2022-01-30 ENCOUNTER — Encounter: Payer: Self-pay | Admitting: Neurology

## 2022-01-30 LAB — INTRINSIC FACTOR ANTIBODIES: Intrinsic Factor: NEGATIVE
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
## 2022-01-31 ENCOUNTER — Telehealth: Payer: Self-pay

## 2022-01-31 ENCOUNTER — Encounter: Payer: Self-pay | Admitting: Neurology

## 2022-01-31 ENCOUNTER — Ambulatory Visit (INDEPENDENT_AMBULATORY_CARE_PROVIDER_SITE_OTHER): Payer: PRIVATE HEALTH INSURANCE | Admitting: Neurology

## 2022-01-31 ENCOUNTER — Telehealth: Payer: Self-pay | Admitting: Neurology

## 2022-01-31 ENCOUNTER — Other Ambulatory Visit: Payer: Self-pay

## 2022-01-31 VITALS — BP 130/91 | HR 124 | Ht 62.0 in | Wt 106.4 lb

## 2022-01-31 DIAGNOSIS — R404 Transient alteration of awareness: Secondary | ICD-10-CM

## 2022-01-31 DIAGNOSIS — G43009 Migraine without aura, not intractable, without status migrainosus: Secondary | ICD-10-CM | POA: Diagnosis not present

## 2022-01-31 MED ORDER — AIMOVIG 140 MG/ML ~~LOC~~ SOAJ: 140.0000 mg | SUBCUTANEOUS | 5 refills | Status: DC

## 2022-01-31 NOTE — Telephone Encounter (Signed)
Advised pt of her MRI appt 02/01/22 at 1 pm. Pt to arrive at 12:30 to check in at the ED entrance.

## 2022-01-31 NOTE — Telephone Encounter (Signed)
Patient's mom Caryl Pina called and said the patient's Aimovig needs a PA now.  Her mom is requesting a sample in the meantime, if possible.

## 2022-01-31 NOTE — Patient Instructions (Addendum)
MRI of brain with and without contrast Routine EEG As per [place] law, no driving for 6 months from episode of altered mental status/loss of awareness. Further recommendations pending results. Stop Qulipta.  Start Aimovig 140mg  every 28 days

## 2022-01-31 NOTE — Telephone Encounter (Signed)
Message Marlowe Kays to start a Pa when she can. Pt to come by and get Aimovig sample.

## 2022-02-01 ENCOUNTER — Ambulatory Visit (HOSPITAL_COMMUNITY)
Admission: RE | Admit: 2022-02-01 | Discharge: 2022-02-01 | Disposition: A | Discharge status: Home / Self Care | Payer: PRIVATE HEALTH INSURANCE | Source: Ambulatory Visit | Attending: Neurology | Admitting: Neurology

## 2022-02-01 DIAGNOSIS — R404 Transient alteration of awareness: Secondary | ICD-10-CM | POA: Insufficient documentation

## 2022-02-01 MED ORDER — GADOBUTROL 1 MMOL/ML IV SOLN
5.0000 mL | Freq: Once | INTRAVENOUS | Status: AC | PRN
  Administered 2022-02-01: 5 mL via INTRAVENOUS

## 2022-02-03 ENCOUNTER — Telehealth: Payer: Self-pay

## 2022-02-03 NOTE — Telephone Encounter (Signed)
Autumn Cortez (Key: [id number]) Rx #: [id number] Aimovig 140MG /ML auto-injectors   Form [employer] Electronic Prior Authorization Form (2017 NCPDP) Created 3 days ago Sent to Plan 5 hours ago Plan Response 5 hours ago Submit Clinical Questions 5 hours ago Determination Favorable 2 hours ago Message from Plan Request Reference Number: [id number]. AIMOVIG INJ 140MG /ML is approved through 02/03/2023. For further questions, call at [phone].

## 2022-02-03 NOTE — Telephone Encounter (Signed)
New message   Your information has been sent to [employer].  Autumn Cortez (Key: [id number]) Rx #: [id number] Aimovig 140MG /ML auto-injectors   Form [employer] Electronic Prior Authorization Form (2017 NCPDP) Created 3 days ago Sent to Plan 7 minutes ago Plan Response 7 minutes ago Submit Clinical Questions less than a minute ago Determination Wait for Determination Please wait for [employer] 2017 NCPDP to return a determination.

## 2022-02-04 ENCOUNTER — Telehealth: Payer: Self-pay

## 2022-02-04 ENCOUNTER — Telehealth: Payer: Self-pay | Admitting: Neurology

## 2022-02-04 NOTE — Telephone Encounter (Signed)
Medication Samples have been provided to the patient.  Drug name: Aimovig       Strength: 140 mg        Qty: 1  LOT: [id number]  Exp.Date: 12/28/2022  Dosing instructions:every 30 days  The patient has been instructed regarding the correct time, dose, and frequency of taking this medication, including desired effects and most common side effects.   Leida Lauth 1:23 PM 02/04/2022

## 2022-02-04 NOTE — Telephone Encounter (Signed)
Patient's mom stopped by the office to pick up some samples.  She also requested a call back about the patient's MRI results.   She has seen them on MyChart and is feeling worried about them.

## 2022-02-04 NOTE — Telephone Encounter (Signed)
Patient mother advised of Dr.Jaffe results note.  Sample given.

## 2022-02-04 NOTE — Progress Notes (Signed)
Patient mother advised of her MRI results.

## 2022-02-05 ENCOUNTER — Ambulatory Visit: Payer: PRIVATE HEALTH INSURANCE | Admitting: Neurology

## 2022-02-05 ENCOUNTER — Ambulatory Visit (INDEPENDENT_AMBULATORY_CARE_PROVIDER_SITE_OTHER): Payer: PRIVATE HEALTH INSURANCE | Admitting: Neurology

## 2022-02-05 ENCOUNTER — Other Ambulatory Visit: Payer: Self-pay

## 2022-02-05 DIAGNOSIS — R404 Transient alteration of awareness: Secondary | ICD-10-CM | POA: Diagnosis not present

## 2022-02-06 ENCOUNTER — Encounter: Payer: Self-pay | Admitting: Family Medicine

## 2022-02-07 ENCOUNTER — Encounter: Payer: Self-pay | Admitting: Family Medicine

## 2022-02-07 ENCOUNTER — Encounter: Payer: Self-pay | Admitting: Neurology

## 2022-02-07 ENCOUNTER — Telehealth: Payer: Self-pay | Admitting: Neurology

## 2022-02-07 NOTE — Telephone Encounter (Signed)
Patient called requesting results from MRI.

## 2022-02-07 NOTE — Telephone Encounter (Signed)
Per pt she has had some migraines so bad that she would black out. Now could this one been that bad to cause the symptoms and low b12.    Please advise.

## 2022-02-07 NOTE — Telephone Encounter (Signed)
Patient and mother advised once Dr.Jaffe reviewed the EEG will call the patient back.

## 2022-02-07 NOTE — Procedures (Signed)
ELECTROENCEPHALOGRAM REPORT  Date of Study: 02/05/2022  Patient's Name: Autumn Cortez MRN: [id number] Date of Birth: August 07, 2001   Clinical History: 21 year old female with recurrent syncope and episode of seizure-like activity  Medications: Gabapentin Adderall XR Qulipta Buspar Lamictal Seroquel Maxalt Zanaflex  Technical Summary: A multichannel digital EEG recording measured by the international 10-20 system with electrodes applied with paste and impedances below 5000 ohms performed in our laboratory with EKG monitoring in an awake and drowsy patient.  Photic stimulation was performed.  The digital EEG was referentially recorded, reformatted, and digitally filtered in a variety of bipolar and referential montages for optimal display.    Description: The patient is awake and drowsy during the recording.  During maximal wakefulness, there is a symmetric, medium voltage 10 Hz posterior dominant rhythm that attenuates with eye opening.  The record is symmetric.  During drowsiness, there is an increase in theta slowing of the background.  Stage 2 sleep was not seen.  Photic stimulation did not elicit any abnormalities.  There were no epileptiform discharges or electrographic seizures seen.    EKG lead was unremarkable.  Impression: This awake and drowsy EEG is normal.    Clinical Correlation: A normal EEG does not exclude a clinical diagnosis of epilepsy.  If further clinical questions remain, prolonged EEG may be helpful.  Clinical correlation is advised.   Shon Millet, DO

## 2022-02-18 NOTE — Telephone Encounter (Signed)
Opened in error

## 2022-02-24 ENCOUNTER — Encounter: Payer: Self-pay | Admitting: Family Medicine

## 2022-02-24 ENCOUNTER — Ambulatory Visit (HOSPITAL_BASED_OUTPATIENT_CLINIC_OR_DEPARTMENT_OTHER)
Admission: RE | Admit: 2022-02-24 | Discharge: 2022-02-24 | Disposition: A | Discharge status: Home / Self Care | Payer: PRIVATE HEALTH INSURANCE | Source: Ambulatory Visit | Attending: Family Medicine | Admitting: Family Medicine

## 2022-02-24 ENCOUNTER — Other Ambulatory Visit: Payer: Self-pay

## 2022-02-24 ENCOUNTER — Ambulatory Visit (INDEPENDENT_AMBULATORY_CARE_PROVIDER_SITE_OTHER): Payer: PRIVATE HEALTH INSURANCE | Admitting: Family Medicine

## 2022-02-24 VITALS — BP 132/77 | HR 104 | Temp 98.0°F | Resp 12 | Ht 62.0 in | Wt 104.2 lb

## 2022-02-24 DIAGNOSIS — M79672 Pain in left foot: Secondary | ICD-10-CM | POA: Diagnosis present

## 2022-02-24 NOTE — Patient Instructions (Addendum)
Ice/cold pack over area for 10-15 min twice daily.  OK to take Tylenol 1000 mg (2 extra strength tabs) or 975 mg (3 regular strength tabs) every 6 hours as needed.  Ibuprofen 400-600 mg (2-3 over the counter strength tabs) every 6 hours as needed for pain.  We will be in touch regarding the final read of your X-ray.  Wear the shoe for as long as it is painful to walk.   I expect significant improvement over the next 7-10 days. If you aren't much better, please let me know.   Let us know if you need anything.

## 2022-02-24 NOTE — Progress Notes (Signed)
Musculoskeletal Exam  Patient: Autumn Cortez DOB: [birth year]  DOS: 02/24/2022  SUBJECTIVE:  Chief Complaint:   Chief Complaint  Patient presents with   Foot Injury    Left foot     Autumn Cortez is a 21 y.o.  female for evaluation and treatment of L pain.   Onset:  Yesterday. Dropped a 100 lb grate on it.  Location: Top of L foot Character:  aching and throbbing  Progression of issue:  is unchanged Associated symptoms: Swelling, bruising, difficulty walking Treatment: to date has been ibuprofen.   Neurovascular symptoms: no  Past Medical History:  Diagnosis Date   ADD (attention deficit disorder with hyperactivity)    Anxiety    Asthma    Bipolar 1 disorder (HCC)    Migraines     Objective: VITAL SIGNS: BP 132/77 (BP Location: Right Arm, Cuff Size: Normal)    Pulse (!) 104    Temp 98 F (36.7 C) (Oral)    Resp 12    Ht 5\' 2"  (1.575 m)    Wt 104 lb 3.2 oz (47.3 kg)    LMP 01/30/2022    SpO2 100%    BMI 19.06 kg/m  Constitutional: Well formed, well developed. No acute distress. Thorax & Lungs: No accessory muscle use Musculoskeletal: L foot.   Tenderness to palpation: yes over distal 1st MT Deformity: no Ecchymosis: yes Neurologic: Normal sensory function. Antalgic gait Psychiatric: Normal mood. Age appropriate judgment and insight. Alert & oriented x 3.    Assessment:  Left foot pain - Plan: DG Foot Complete Left  Plan: NSAIDs, XR unofficially negative, ice, Tylenol. Flat shoe. Send message in 1 week if no better, will set up w sports med.  F/u prn. The patient voiced understanding and agreement to the plan.   03/30/2022 [place], DO 02/24/22  12:15 PM

## 2022-03-26 ENCOUNTER — Ambulatory Visit: Payer: PRIVATE HEALTH INSURANCE | Admitting: Neurology

## 2022-03-27 ENCOUNTER — Encounter: Payer: Self-pay | Admitting: Family Medicine

## 2022-03-27 DIAGNOSIS — Z3041 Encounter for surveillance of contraceptive pills: Secondary | ICD-10-CM

## 2022-03-28 MED ORDER — DROSPIRENONE-ETHINYL ESTRADIOL 3-0.02 MG PO TABS: 1.0000 | ORAL_TABLET | Freq: Every day | ORAL | 3 refills | Status: DC

## 2022-04-15 ENCOUNTER — Other Ambulatory Visit: Payer: Self-pay | Admitting: Family Medicine

## 2022-04-15 DIAGNOSIS — F900 Attention-deficit hyperactivity disorder, predominantly inattentive type: Secondary | ICD-10-CM

## 2022-04-15 MED ORDER — AMPHETAMINE-DEXTROAMPHET ER 20 MG PO CP24: 20.0000 mg | ORAL_CAPSULE | Freq: Every day | ORAL | 0 refills | Status: DC

## 2022-04-15 NOTE — Telephone Encounter (Signed)
Requesting: Adderall XR 20mg  ?Contract: none ?UDS: none ?Last Visit: 02/24/22 ?Next Visit:  04/21/22 ?Last Refill: 03/07/22 ? ?Please Advise ? ?

## 2022-04-21 ENCOUNTER — Ambulatory Visit (INDEPENDENT_AMBULATORY_CARE_PROVIDER_SITE_OTHER): Payer: Medicaid Other | Admitting: Family Medicine

## 2022-04-21 ENCOUNTER — Encounter: Payer: Self-pay | Admitting: Family Medicine

## 2022-04-21 VITALS — BP 118/68 | HR 99 | Temp 98.1°F | Ht 62.0 in | Wt 102.4 lb

## 2022-04-21 DIAGNOSIS — Z0000 Encounter for general adult medical examination without abnormal findings: Secondary | ICD-10-CM | POA: Diagnosis not present

## 2022-04-21 DIAGNOSIS — Z79899 Other long term (current) drug therapy: Secondary | ICD-10-CM

## 2022-04-21 MED ORDER — QUETIAPINE FUMARATE 200 MG PO TABS: 200.0000 mg | ORAL_TABLET | Freq: Every evening | ORAL | 2 refills | Status: DC | PRN

## 2022-04-21 NOTE — Progress Notes (Signed)
Chief Complaint  ?Patient presents with  ? Annual Exam  ?  ? ?Well Woman ?Autumn Cortez is here for a complete physical.   ?Her last physical was >1 year ago.  ?Current diet: in general, a "healthy" diet. ?Current exercise: some walking, little exercise. ?Contraception? Yes- OCP ?Fatigue out of ordinary? No ?Seatbelt? Yes ?Advanced directive? No ? ?Health Maintenance ?Tetanus- Yes ?HIV screening- Yes ?Hep C screening- Yes ? ?Past Medical History:  ?Diagnosis Date  ? ADD (attention deficit disorder with hyperactivity)   ? Anxiety   ? Asthma   ? Bipolar 1 disorder (HCC)   ? Migraines   ?  ? ?Past Surgical History:  ?Procedure Laterality Date  ? ADENOIDECTOMY  8/11  ? TONSILLECTOMY    ? ? ?Medications  ?Current Outpatient Medications on File Prior to Visit  ?Medication Sig Dispense Refill  ? albuterol (VENTOLIN HFA) 108 (90 Base) MCG/ACT inhaler Inhale 2 puffs into the lungs every 6 (six) hours as needed for shortness of breath. 18 g 3  ? amphetamine-dextroamphetamine (ADDERALL XR) 20 MG 24 hr capsule Take 1 capsule (20 mg total) by mouth daily. 30 capsule 0  ? [START ON 05/15/2022] amphetamine-dextroamphetamine (ADDERALL XR) 20 MG 24 hr capsule Take 1 capsule (20 mg total) by mouth daily. 30 capsule 0  ? [START ON 06/14/2022] amphetamine-dextroamphetamine (ADDERALL XR) 20 MG 24 hr capsule Take 1 capsule (20 mg total) by mouth daily. 30 capsule 0  ? budesonide (PULMICORT) 180 MCG/ACT inhaler Inhale 1 puff into the lungs 2 (two) times daily. 3 each 2  ? busPIRone (BUSPAR) 7.5 MG tablet Take 1 tablet (7.5 mg total) by mouth 2 (two) times daily. 180 tablet 2  ? drospirenone-ethinyl estradiol (YAZ) 3-0.02 MG tablet Take 1 tablet by mouth daily. 84 tablet 3  ? Erenumab-aooe (AIMOVIG) 140 MG/ML SOAJ Inject 140 mg into the skin every 28 (twenty-eight) days. 1.12 mL 5  ? fluticasone (FLONASE) 50 MCG/ACT nasal spray Place 2 sprays into both nostrils daily. 16 g 1  ? gabapentin (NEURONTIN) 300 MG capsule Take 1-2 capsule  3 times daily as needed for panic attacks. 30 capsule 3  ? lamoTRIgine (LAMICTAL) 100 MG tablet Take 1 tablet (100 mg total) by mouth 2 (two) times daily. 180 tablet 2  ? montelukast (SINGULAIR) 10 MG tablet TAKE 1 TABLET(10 MG) BY MOUTH AT BEDTIME 30 tablet 3  ? ondansetron (ZOFRAN ODT) 8 MG disintegrating tablet Take 1 tablet (8 mg total) by mouth every 8 (eight) hours as needed for nausea or vomiting. 20 tablet 5  ? rizatriptan (MAXALT-MLT) 10 MG disintegrating tablet Take 1 tablet (10 mg total) by mouth as needed for migraine (May repeat in 2 hours.  Maximum 2 tablets in 24 hours). May repeat in 2 hours if needed 10 tablet 5  ? ?Allergies ?Allergies  ?Allergen Reactions  ? Pollen Extract Other (See Comments)  ?  Seasonal allergies - runny nose, sneezing, watery eyes ?Congestion and sneezing  ? ? ?Review of Systems: ?Constitutional:  no unexpected weight changes ?Eye:  no recent significant change in vision ?Ear/Nose/Mouth/Throat:  Ears:  no tinnitus or vertigo and no recent change in hearing ?Nose/Mouth/Throat:  no complaints of nasal congestion, no sore throat ?Cardiovascular: no chest pain ?Respiratory:  no cough and no shortness of breath ?Gastrointestinal:  no abdominal pain, no change in bowel habits ?GU:  Female: negative for dysuria or pelvic pain ?Musculoskeletal/Extremities:  no pain of the joints ?Integumentary (Skin/Breast):  no abnormal skin lesions reported ?Neurologic:  no headaches ?Endocrine:  denies fatigue ?Hematologic/Lymphatic:  No areas of easy bleeding ? ?Exam ?BP 118/68   Pulse 99   Temp 98.1 ?F (36.7 ?C) (Oral)   Ht 5\' 2"  (1.575 m)   Wt 102 lb 6 oz (46.4 kg)   SpO2 93%   BMI 18.72 kg/m?  ?General:  well developed, well nourished, in no apparent distress ?Skin:  no significant moles, warts, or growths ?Head:  no masses, lesions, or tenderness ?Eyes:  pupils equal and round, sclera anicteric without injection ?Ears:  canals without lesions, TMs shiny without retraction, no obvious  effusion, no erythema ?Nose:  nares patent, septum midline, mucosa normal, and no drainage or sinus tenderness ?Throat/Pharynx:  lips and gingiva without lesion; tongue and uvula midline; non-inflamed pharynx; no exudates or postnasal drainage ?Neck: neck supple without adenopathy, thyromegaly, or masses ?Lungs:  clear to auscultation, breath sounds equal bilaterally, no respiratory distress ?Cardio:  regular rate and rhythm, no bruits, no LE edema ?Abdomen:  abdomen soft, nontender; bowel sounds normal; no masses or organomegaly ?Genital: Defer to GYN ?Musculoskeletal:  symmetrical muscle groups noted without atrophy or deformity ?Extremities:  no clubbing, cyanosis, or edema, no deformities, no skin discoloration ?Neuro:  gait normal; deep tendon reflexes normal and symmetric ?Psych: well oriented with normal range of affect and appropriate judgment/insight ? ?Assessment and Plan ? ?Well adult exam - Plan: CBC, Comprehensive metabolic panel, Lipid panel ? ?Encounter for long-term (current) use of high-risk medication - Plan: Drug Monitoring Panel [phone] , Urine  ? ?Well 21 y.o. female. ?Counseled on diet and exercise. ?Advanced directive form provided today.  ?UTD w vaccinations.  ?Other orders as above. ?Follow up in 6 mo or prn. ?The patient voiced understanding and agreement to the plan. ? ?Shelda Pal, DO ?04/21/22 ?1:17 PM ? ?

## 2022-04-21 NOTE — Patient Instructions (Signed)
Give us 2-3 business days to get the results of your labs back.   Keep the diet clean and stay active.  Please get me a copy of your advanced directive form at your convenience.   Let us know if you need anything.  

## 2022-04-22 ENCOUNTER — Encounter: Payer: Self-pay | Admitting: Family Medicine

## 2022-04-22 ENCOUNTER — Ambulatory Visit (INDEPENDENT_AMBULATORY_CARE_PROVIDER_SITE_OTHER): Payer: PRIVATE HEALTH INSURANCE | Admitting: Family Medicine

## 2022-04-22 VITALS — BP 100/64 | HR 84 | Temp 98.2°F | Resp 16 | Ht 62.0 in | Wt 103.0 lb

## 2022-04-22 DIAGNOSIS — R110 Nausea: Secondary | ICD-10-CM | POA: Diagnosis not present

## 2022-04-22 DIAGNOSIS — H6123 Impacted cerumen, bilateral: Secondary | ICD-10-CM | POA: Diagnosis not present

## 2022-04-22 DIAGNOSIS — H6122 Impacted cerumen, left ear: Secondary | ICD-10-CM

## 2022-04-22 LAB — LIPID PANEL
Cholesterol: 155 mg/dL (ref 0–200)
HDL: 64 mg/dL (ref >39.00)
LDL Cholesterol: 64 mg/dL (ref 0–99)
NonHDL: 90.54
Total CHOL/HDL Ratio: 2
Triglycerides: 133 mg/dL (ref 0.0–149.0)
VLDL: 26.6 mg/dL (ref 0.0–40.0)

## 2022-04-22 LAB — COMPREHENSIVE METABOLIC PANEL
ALT: 12 U/L (ref 0–35)
AST: 15 U/L (ref 0–37)
Albumin: 4.1 g/dL (ref 3.5–5.2)
Alkaline Phosphatase: 60 U/L (ref 47–119)
BUN: 10 mg/dL (ref 6–23)
CO2: 27 mEq/L (ref 19–32)
Calcium: 9.5 mg/dL (ref 8.4–10.5)
Chloride: 105 mEq/L (ref 96–112)
Creatinine, Ser: 0.93 mg/dL (ref 0.40–1.20)
GFR: 88.86 mL/min (ref >60.00)
Glucose, Bld: 93 mg/dL (ref 70–99)
Potassium: 4.3 mEq/L (ref 3.5–5.1)
Sodium: 139 mEq/L (ref 135–145)
Total Bilirubin: 0.5 mg/dL (ref 0.2–1.2)
Total Protein: 6.3 g/dL (ref 6.0–8.3)

## 2022-04-22 LAB — CBC
HCT: 43.3 % (ref 36.0–49.0)
Hemoglobin: 14.5 g/dL (ref 12.0–16.0)
MCHC: 33.4 g/dL (ref 31.0–37.0)
MCV: 88.7 fl (ref 78.0–98.0)
Platelets: 286 10*3/uL (ref 150.0–575.0)
RBC: 4.88 Mil/uL (ref 3.80–5.70)
RDW: 12.8 % (ref 11.4–15.5)
WBC: 6.3 10*3/uL (ref 4.5–13.5)

## 2022-04-22 MED ORDER — PANTOPRAZOLE SODIUM 40 MG PO TBEC: 40.0000 mg | DELAYED_RELEASE_TABLET | Freq: Every day | ORAL | 1 refills | Status: DC

## 2022-04-22 NOTE — Patient Instructions (Addendum)
OK to use Debrox (peroxide) in the ear to loosen up wax. Also recommend using a bulb syringe (for removing boogers from baby's noses) to flush through warm water and vinegar (3-4:1 ratio). An alternative, though more expensive, is an elephant ear washer wax removal kit. Do not use Q-tips as this can impact wax further. ? ?Stop the Adderall for the next few days. We will know in 4-5 days if this is the cause or not.  ? ?If no changes in your symptoms, take the medicine I sent in once daily.  ? ?Let us know if you need anything. ?

## 2022-04-22 NOTE — Progress Notes (Addendum)
Chief Complaint  ?Patient presents with  ? eating problems  ?  Ear fullness ?  ? ? ?Subjective: ?Patient is a 21 y.o. female here for ear fullness. ? ?Patient has a history of cerumen impaction.  She noticed worsening hearing on her left side over the past few weeks.  She will sometimes use Q-tips.  No trauma to her ear.  She denies any injury or drainage from her ears.  She is not having any nasal congestion.  She has not tried anything at home so far. ? ?Over the past month, the patient has had nausea after eating.  It does not seem to matter what she eats but is usually solids, no liquids.  He is not throwing up, denies sore throat, coughing, heartburn, abdominal pain, bowel changes, or bleeding.   ? ?Past Medical History:  ?Diagnosis Date  ? ADD (attention deficit disorder with hyperactivity)   ? Anxiety   ? Asthma   ? Bipolar 1 disorder ([place])   ? Migraines   ? ? ?Objective: ?BP 100/64 (BP Location: Right Arm, Patient Position: Sitting, Cuff Size: Normal)   Pulse 84   Temp 98.2 ?F (36.8 ?C) (Oral)   Resp 16   Ht 5\' 2"  (1.575 m)   Wt 103 lb (46.7 kg)   SpO2 97%   BMI 18.84 kg/m?  ?General: Awake, appears stated age ?Heart: RRR, no LE edema ?Lungs: CTAB, no rales, wheezes or rhonchi. No accessory muscle use ?Ears: 100% obstructed with cerumen bilaterally ?Abdomen: Bowel sounds present, soft, nontender, nondistended, negative Murphy's, McBurney's, Rovsing's, Carnett's ?Psych: Age appropriate judgment and insight, normal affect and mood ? ?Assessment and Plan: ?Impacted cerumen, bilateral ? ?Nausea - Plan: pantoprazole (PROTONIX) 40 MG tablet ? ?Flushed out today, successful removal, complete resolution of symptoms.  Home care instructions verbalized and written down in AVS. ?Significant could be related to reflux.  We will also have her stop her Adderall to see if it is related to that.  She will let me know in the next 4 to 5 days if it is.  If she does well, we will discuss an alternative to Adderall.  She  did not do well, we will restart the Adderall and trial Protonix. ?The patient voiced understanding and agreement to the plan. ? ?Shelda Pal, DO ?04/23/22  ?4:42 PM ? ? ? ? ?

## 2022-04-24 LAB — DRUG MONITORING PANEL 376104, URINE
Amphetamine: 4324 ng/mL — ABNORMAL HIGH (ref <250)
Amphetamines: POSITIVE ng/mL — AB (ref <500)
Barbiturates: NEGATIVE ng/mL (ref <300)
Benzodiazepines: NEGATIVE ng/mL (ref <100)
Cocaine Metabolite: NEGATIVE ng/mL (ref <150)
Desmethyltramadol: NEGATIVE ng/mL (ref <100)
Methamphetamine: NEGATIVE ng/mL (ref <250)
Opiates: NEGATIVE ng/mL (ref <100)
Oxycodone: NEGATIVE ng/mL (ref <100)
Tramadol: NEGATIVE ng/mL (ref <100)

## 2022-04-24 LAB — DM TEMPLATE

## 2022-05-16 ENCOUNTER — Other Ambulatory Visit: Payer: Self-pay | Admitting: Family Medicine

## 2022-05-16 DIAGNOSIS — J0140 Acute pansinusitis, unspecified: Secondary | ICD-10-CM

## 2022-05-19 ENCOUNTER — Encounter: Payer: Self-pay | Admitting: Family Medicine

## 2022-05-19 DIAGNOSIS — J0140 Acute pansinusitis, unspecified: Secondary | ICD-10-CM

## 2022-05-19 MED ORDER — MONTELUKAST SODIUM 10 MG PO TABS: ORAL_TABLET | ORAL | 3 refills | Status: DC

## 2022-05-23 ENCOUNTER — Encounter: Payer: Self-pay | Admitting: Family Medicine

## 2022-05-23 ENCOUNTER — Ambulatory Visit (INDEPENDENT_AMBULATORY_CARE_PROVIDER_SITE_OTHER): Payer: Medicaid Other | Admitting: Family Medicine

## 2022-05-23 VITALS — BP 102/60 | HR 84 | Temp 98.0°F | Ht 62.0 in | Wt 105.5 lb

## 2022-05-23 DIAGNOSIS — R109 Unspecified abdominal pain: Secondary | ICD-10-CM | POA: Diagnosis not present

## 2022-05-23 DIAGNOSIS — F900 Attention-deficit hyperactivity disorder, predominantly inattentive type: Secondary | ICD-10-CM | POA: Diagnosis not present

## 2022-05-23 NOTE — Progress Notes (Signed)
Chief Complaint  Patient presents with   Follow-up    1 month     Subjective: Patient is a 21 y.o. female here for f/u.  Stopped Adderall, reports improvement in nausea and appetite, started to gain weight again.  She is taking the Protonix 40 mg daily and reports compliance without adverse effects.  She has around 1 week left before running out.  Since stopping the Adderall, she has noticed no change in her focus or attention.  She denies any bleeding, palpitations, nausea, vomiting, or nighttime awakenings.  Past Medical History:  Diagnosis Date   ADD (attention deficit disorder with hyperactivity)    Anxiety    Asthma    Bipolar 1 disorder (HCC)    Migraines     Objective: BP 102/60   Pulse 84   Temp 98 F (36.7 C) (Oral)   Ht 5\' 2"  (1.575 m)   Wt 105 lb 8 oz (47.9 kg)   SpO2 99%   BMI 19.30 kg/m  General: Awake, appears stated age Heart: RRR, no LE edema Lungs: CTAB, no rales, wheezes or rhonchi. No accessory muscle use Abd: BS+, S, NT, ND Psych: Age appropriate judgment and insight, normal affect and mood  Assessment and Plan: Abdominal pain, unspecified abdominal location  ADHD (attention deficit hyperactivity disorder), inattentive type  Doing much better, will cont Protonix 40 mg/d for 7 more d and then stop. She will let me know if she needs to restart. Could transition to Pepcid 20 mg bid OTC.  Attention has been good off of med. Will remove from list. If s/s's from this resume, she will let me know.  F/u in 5 mo for med check or prn.  The patient voiced understanding and agreement to the plan.  [place], DO 05/23/22  1:26 PM

## 2022-05-23 NOTE — Patient Instructions (Signed)
Stay off the Adderall for now. If you need it sent in again, let me know.   Once your prescription of the pantoprazole expires, OK to stop. If you need to refill it, let me know.   Let us know if you need anything.
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
## 2022-07-04 ENCOUNTER — Ambulatory Visit: Payer: Medicaid Other | Admitting: Neurology

## 2022-07-04 ENCOUNTER — Encounter: Payer: Self-pay | Admitting: Neurology

## 2022-07-04 VITALS — BP 120/84 | HR 100 | Ht 62.0 in | Wt 111.0 lb

## 2022-07-04 DIAGNOSIS — R404 Transient alteration of awareness: Secondary | ICD-10-CM

## 2022-07-04 DIAGNOSIS — G43009 Migraine without aura, not intractable, without status migrainosus: Secondary | ICD-10-CM

## 2022-07-07 ENCOUNTER — Ambulatory Visit (INDEPENDENT_AMBULATORY_CARE_PROVIDER_SITE_OTHER): Payer: Medicaid Other | Admitting: Family Medicine

## 2022-07-07 ENCOUNTER — Encounter: Payer: Self-pay | Admitting: Family Medicine

## 2022-07-07 VITALS — BP 120/68 | HR 92 | Temp 98.4°F | Ht 62.0 in | Wt 109.0 lb

## 2022-07-07 DIAGNOSIS — L568 Other specified acute skin changes due to ultraviolet radiation: Secondary | ICD-10-CM

## 2022-07-07 MED ORDER — TRIAMCINOLONE ACETONIDE 0.1 % EX CREA: 1.0000 | TOPICAL_CREAM | Freq: Two times a day (BID) | CUTANEOUS | 0 refills | Status: DC

## 2022-07-07 NOTE — Progress Notes (Signed)
Chief Complaint  Patient presents with   skin problems    Autumn Cortez is a 21 y.o. female here for a skin complaint.  Duration: 1 month; gets after she is in sun/has a sun burn Location: R shoulder, thighs, upper R chest Pruritic? Sometimes  Painful? Yes Drainage? No New soaps/lotions/topicals/detergents? No Sick contacts? No Other associated symptoms: no fevers Therapies tried thus far: none; does wear SPF 30+ sunscreen  Past Medical History:  Diagnosis Date   ADD (attention deficit disorder with hyperactivity)    Anxiety    Asthma    Bipolar 1 disorder (HCC)    Migraines     BP 120/68   Pulse 92   Temp 98.4 F (36.9 C) (Oral)   Ht 5\' 2"  (1.575 m)   Wt 109 lb (49.4 kg)   SpO2 99%   BMI 19.94 kg/m  Gen: awake, alert, appearing stated age Lungs: No accessory muscle use Skin: hypopigmented patch on R shoulder, scaly/excoriated linear band of tissue along R upper chest. No drainage, erythema, TTP, fluctuance Psych: Age appropriate judgment and insight  Photosensitivity dermatitis - Plan: triamcinolone cream (KENALOG) 0.1 %  Looks like PMLE. She needs to reapply sunscreen every 80-90 min if she gets in water. Sun protected rec'd. Kenalog for irritation of skin currently.  F/u prn. The patient voiced understanding and agreement to the plan.  [place], DO 07/07/22 8:32 AM

## 2022-07-07 NOTE — Patient Instructions (Signed)
Reapply sunscreen after 80-90 minutes if outside/wet.   Try to use sun protection.  Let us know if you need anything.

## 2022-08-09 ENCOUNTER — Encounter: Payer: Self-pay | Admitting: Neurology

## 2022-08-11 ENCOUNTER — Other Ambulatory Visit: Payer: Self-pay

## 2022-08-11 MED ORDER — AIMOVIG 140 MG/ML ~~LOC~~ SOAJ: 140.0000 mg | SUBCUTANEOUS | 4 refills | Status: DC

## 2022-09-09 ENCOUNTER — Other Ambulatory Visit: Payer: Self-pay | Admitting: Family Medicine

## 2022-09-09 DIAGNOSIS — F410 Panic disorder [episodic paroxysmal anxiety] without agoraphobia: Secondary | ICD-10-CM

## 2022-09-09 DIAGNOSIS — J0140 Acute pansinusitis, unspecified: Secondary | ICD-10-CM

## 2022-09-09 MED ORDER — BUSPIRONE HCL 7.5 MG PO TABS: 7.5000 mg | ORAL_TABLET | Freq: Two times a day (BID) | ORAL | 2 refills | Status: DC

## 2022-09-15 ENCOUNTER — Ambulatory Visit (HOSPITAL_BASED_OUTPATIENT_CLINIC_OR_DEPARTMENT_OTHER)
Admission: RE | Admit: 2022-09-15 | Discharge: 2022-09-15 | Disposition: A | Discharge status: Home / Self Care | Payer: Medicaid Other | Source: Ambulatory Visit | Attending: Family Medicine | Admitting: Family Medicine

## 2022-09-15 ENCOUNTER — Ambulatory Visit (INDEPENDENT_AMBULATORY_CARE_PROVIDER_SITE_OTHER): Payer: Medicaid Other | Admitting: Family Medicine

## 2022-09-15 ENCOUNTER — Encounter: Payer: Self-pay | Admitting: Family Medicine

## 2022-09-15 VITALS — BP 112/60 | HR 74 | Temp 98.0°F | Ht 62.0 in | Wt 115.2 lb

## 2022-09-15 DIAGNOSIS — M79645 Pain in left finger(s): Secondary | ICD-10-CM

## 2022-09-15 DIAGNOSIS — J309 Allergic rhinitis, unspecified: Secondary | ICD-10-CM

## 2022-09-15 MED ORDER — PREDNISONE 20 MG PO TABS: 40.0000 mg | ORAL_TABLET | Freq: Every day | ORAL | 0 refills | Status: AC

## 2022-09-15 NOTE — Patient Instructions (Addendum)
Ice/cold pack over area for 10-15 min twice daily.  OK to take Tylenol 1000 mg (2 extra strength tabs) or 975 mg (3 regular strength tabs) every 6 hours as needed.  Send me a message if things are not improving.  We will be in touch regarding your imaging results.   Buddy tape your index finger to your middle finger for the next 1-2 weeks.   Let us know if you need anything.

## 2022-09-15 NOTE — Progress Notes (Signed)
Musculoskeletal Exam  Patient: Autumn Cortez DOB: 11-Aug-2001  DOS: 09/15/2022  SUBJECTIVE:  Chief Complaint:   Chief Complaint  Patient presents with   left index finger swelling     This happened last night    Cough    Florance Paolillo is a 21 y.o.  female for evaluation and treatment of L finger pain.   Onset:  1 day ago. 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  Location: dorsum of prox phalanx Character:  sharp  Progression of issue:  is unchanged Associated symptoms: swelling, decreased sensation over the dorsum, decreased ROM Treatment: to date has been OTC NSAIDS.   Neurovascular symptoms: no weakness outside of pain  Duration: 1 week  Associated symptoms: sinus congestion, sinus pain, rhinorrhea, itchy watery eyes, ear pain, sore throat, and cough Denies: ear drainage, wheezing, shortness of breath, myalgia, and fevers Treatment to date: daytime cold and flu meds Sick contacts: No  Past Medical History:  Diagnosis Date   ADD (attention deficit disorder with hyperactivity)    Anxiety    Asthma    Bipolar 1 disorder (HCC)    Migraines     Objective: VITAL SIGNS: BP 112/60   Pulse 74   Temp 98 F (36.7 C) (Oral)   Ht 5\' 2"  (1.575 m)   Wt 115 lb 3.2 oz (52.3 kg)   LMP 09/12/2022   SpO2 99%   BMI 21.07 kg/m  Constitutional: Well formed, well developed. No acute distress. Heart: RRR HEENT: Ear canals are patent without otorrhea, TMs negative bilaterally, nares are patent, turbinates are pale, no rhinorrhea, MMM, no pharyngeal exudate or erythema Thorax & Lungs: CTAB. No accessory muscle use Musculoskeletal: L finger.   Normal active range of motion: no.   Normal passive range of motion: no Tenderness to palpation: yes; dorsum of 2nd prox phalanx Deformity: no Ecchymosis: no Abrasion on dorsum of 2nd left proximal phalanx Slight soft tissue swelling over this area as well Neurologic: Normal sensory function.  Psychiatric: Normal  mood. Age appropriate judgment and insight. Alert & oriented x 3.    Assessment:  Finger pain, left - Plan: DG Finger Index Left, predniSONE (DELTASONE) 20 MG tablet  Allergic rhinitis, unspecified seasonality, unspecified trigger - Plan: predniSONE (DELTASONE) 20 MG tablet  Plan: 5-day prednisone burst for pain.  This hopefully will help with her upper respiratory issues as well.  Checking x-ray of finger to ensure no fracture.  Buddy tape the area for now.  Ice, Tylenol.  F/u prn. The patient voiced understanding and agreement to the plan.   [place], DO 09/15/22  12:10 PM

## 2022-09-18 ENCOUNTER — Other Ambulatory Visit: Payer: Self-pay | Admitting: Family Medicine

## 2022-09-18 ENCOUNTER — Encounter: Payer: Self-pay | Admitting: Family Medicine

## 2022-09-18 MED ORDER — AZITHROMYCIN 250 MG PO TABS: ORAL_TABLET | ORAL | 0 refills | Status: DC

## 2022-09-19 MED ORDER — ALBUTEROL SULFATE HFA 108 (90 BASE) MCG/ACT IN AERS: 2.0000 | INHALATION_SPRAY | Freq: Four times a day (QID) | RESPIRATORY_TRACT | 5 refills | Status: DC | PRN

## 2022-09-23 ENCOUNTER — Encounter: Payer: Self-pay | Admitting: Family Medicine

## 2022-09-23 ENCOUNTER — Ambulatory Visit (INDEPENDENT_AMBULATORY_CARE_PROVIDER_SITE_OTHER): Payer: Medicaid Other | Admitting: Family Medicine

## 2022-09-23 VITALS — BP 108/60 | HR 94 | Temp 98.0°F | Ht 62.0 in | Wt 116.5 lb

## 2022-09-23 DIAGNOSIS — J4531 Mild persistent asthma with (acute) exacerbation: Secondary | ICD-10-CM

## 2022-09-23 DIAGNOSIS — J0100 Acute maxillary sinusitis, unspecified: Secondary | ICD-10-CM

## 2022-09-23 MED ORDER — METHYLPREDNISOLONE ACETATE 80 MG/ML IJ SUSP
80.0000 mg | Freq: Once | INTRAMUSCULAR | Status: AC
  Administered 2022-09-23: 80 mg via INTRAMUSCULAR

## 2022-09-23 MED ORDER — FLUCONAZOLE 150 MG PO TABS: ORAL_TABLET | ORAL | 0 refills | Status: DC

## 2022-09-23 MED ORDER — DOXYCYCLINE HYCLATE 100 MG PO TABS: 100.0000 mg | ORAL_TABLET | Freq: Two times a day (BID) | ORAL | 0 refills | Status: DC

## 2022-09-23 NOTE — Patient Instructions (Signed)
Continue to push fluids, practice good hand hygiene, and cover your mouth if you cough. ? ?If you start having fevers, shaking or shortness of breath, seek immediate care. ? ?OK to take Tylenol 1000 mg (2 extra strength tabs) or 975 mg (3 regular strength tabs) every 6 hours as needed. ? ?Send me a message in 2-3 days if no better.  ? ?Let us know if you need anything. ?

## 2022-09-23 NOTE — Progress Notes (Signed)
Chief Complaint  Patient presents with   Follow-up    Symptoms have worsened    Autumn Cortez here for URI complaints.  Duration: 2 weeks  Associated symptoms: sinus congestion, sinus pain, rhinorrhea, wheezing, shortness of breath, myalgia, and coughing, nausea Denies: itchy watery eyes, ear pain, ear drainage, sore throat, and vomiting, diarrhea Treatment to date: prednisone, Zpak Sick contacts: No  Past Medical History:  Diagnosis Date   ADD (attention deficit disorder with hyperactivity)    Anxiety    Asthma    Bipolar 1 disorder (HCC)    Migraines     Objective BP 108/60   Pulse 94   Temp 98 F (36.7 C) (Oral)   Ht 5\' 2"  (1.575 m)   Wt 116 lb 8 oz (52.8 kg)   LMP 09/12/2022   SpO2 98%   BMI 21.31 kg/m  General: Awake, alert, appears stated age HEENT: AT, Autumn Cortez, ears patent b/l and TM's neg, nares patent w/o discharge, pharynx pink and without exudates, MMM, max and frontal sinuses ttp b/l Neck: No masses or asymmetry Heart: RRR Lungs: CTAB tho low air flow throughout, no accessory muscle use Psych: Age appropriate judgment and insight, normal mood and affect  Acute maxillary sinusitis, recurrence not specified - Plan: fluconazole (DIFLUCAN) 150 MG tablet, doxycycline (VIBRA-TABS) 100 MG tablet, methylPREDNISolone acetate (DEPO-MEDROL) injection 80 mg  Mild persistent asthma with acute exacerbation  Still coughing, will change Zpak to doxy. Depo injection today given tight lungs and asthma. Continue to push fluids, practice good hand hygiene, cover mouth when coughing. Exacerbation of chronic issue.  Depo-Medrol injection as above. Could consider LABA.   She will send a message in a couple days if no better and we will get a chest x-ray. Pt voiced understanding and agreement to the plan.  [place], DO 09/23/22 11:47 AM

## 2022-10-21 ENCOUNTER — Encounter: Payer: Self-pay | Admitting: Family Medicine

## 2022-10-21 ENCOUNTER — Ambulatory Visit (INDEPENDENT_AMBULATORY_CARE_PROVIDER_SITE_OTHER): Payer: Medicaid Other | Admitting: Family Medicine

## 2022-10-21 VITALS — BP 114/72 | HR 71 | Temp 98.3°F | Ht 62.0 in | Wt 117.0 lb

## 2022-10-21 DIAGNOSIS — F900 Attention-deficit hyperactivity disorder, predominantly inattentive type: Secondary | ICD-10-CM

## 2022-10-21 DIAGNOSIS — F319 Bipolar disorder, unspecified: Secondary | ICD-10-CM | POA: Diagnosis not present

## 2022-10-21 MED ORDER — AMPHETAMINE-DEXTROAMPHET ER 20 MG PO CP24: 20.0000 mg | ORAL_CAPSULE | Freq: Every day | ORAL | 0 refills | Status: DC

## 2022-10-21 MED ORDER — LAMOTRIGINE 100 MG PO TABS: 100.0000 mg | ORAL_TABLET | Freq: Two times a day (BID) | ORAL | 2 refills | Status: DC

## 2022-10-21 NOTE — Patient Instructions (Addendum)
Aim to do some physical exertion for 150 minutes per week. This is typically divided into 5 days per week, 30 minutes per day. The activity should be enough to get your heart rate up. Anything is better than nothing if you have time constraints.  Don't get pregnant on these medications please.   Let us know if you need anything.

## 2022-10-21 NOTE — Progress Notes (Signed)
Chief Complaint  Patient presents with   Follow-up    6 month    Subjective Autumn Cortez presents for f/u Bipolar 1 disorder and ADHD.  Pt is currently being treated with BuSpar 7.5 mg bid, Seroquel 200 mg qhs, Lamictal 100 mg bid. .  Reports doing well since treatment. No thoughts of harming self or others. No self-medication with alcohol, prescription drugs or illicit drugs. Pt is not following with a counselor/psychologist.  Adhd Hx of adhd. Was on Adderall in past, taking classes Wednesdays and would like to take on Wednesdays and days where she needs to study and prepare.   Past Medical History:  Diagnosis Date   ADD (attention deficit disorder with hyperactivity)    Anxiety    Asthma    Bipolar 1 disorder ([place])    Migraines    Allergies as of 10/21/2022       Reactions   Pollen Extract Other (See Comments)   Seasonal allergies - runny nose, sneezing, watery eyes Congestion and sneezing        Medication List        Accurate as of October 21, 2022  1:04 PM. If you have any questions, ask your nurse or doctor.          STOP taking these medications    doxycycline 100 MG tablet Commonly known as: VIBRA-TABS Stopped by: Shelda Pal, DO   triamcinolone cream 0.1 % Commonly known as: KENALOG Stopped by: Shelda Pal, DO       TAKE these medications    Aimovig 140 MG/ML Soaj Generic drug: Erenumab-aooe Inject 140 mg into the skin every 28 (twenty-eight) days.   albuterol 108 (90 Base) MCG/ACT inhaler Commonly known as: VENTOLIN HFA Inhale 2 puffs into the lungs every 6 (six) hours as needed for shortness of breath or wheezing.   amphetamine-dextroamphetamine 20 MG 24 hr capsule Commonly known as: Adderall XR Take 1 capsule (20 mg total) by mouth daily. Started by: Shelda Pal, DO   amphetamine-dextroamphetamine 20 MG 24 hr capsule Commonly known as: ADDERALL XR Take 1 capsule (20 mg total) by mouth  daily. Start taking on: November 20, 2022 Started by: Crosby Oyster Akeem Heppler, DO   budesonide 180 MCG/ACT inhaler Commonly known as: PULMICORT Inhale 1 puff into the lungs 2 (two) times daily.   busPIRone 7.5 MG tablet Commonly known as: BUSPAR Take 1 tablet (7.5 mg total) by mouth 2 (two) times daily.   drospirenone-ethinyl estradiol 3-0.02 MG tablet Commonly known as: YAZ Take 1 tablet by mouth daily.   fluconazole 150 MG tablet Commonly known as: DIFLUCAN Take 1 tab, repeat in 72 hours if no improvement.   fluticasone 50 MCG/ACT nasal spray Commonly known as: FLONASE Place 2 sprays into both nostrils daily.   gabapentin 300 MG capsule Commonly known as: NEURONTIN Take 1-2 capsule 3 times daily as needed for panic attacks.   lamoTRIgine 100 MG tablet Commonly known as: LAMICTAL Take 1 tablet (100 mg total) by mouth 2 (two) times daily.   montelukast 10 MG tablet Commonly known as: SINGULAIR TAKE 1 TABLET(10 MG) BY MOUTH AT BEDTIME   ondansetron 8 MG disintegrating tablet Commonly known as: Zofran ODT Take 1 tablet (8 mg total) by mouth every 8 (eight) hours as needed for nausea or vomiting.   pantoprazole 40 MG tablet Commonly known as: PROTONIX Take 1 tablet (40 mg total) by mouth daily.   QUEtiapine 200 MG tablet Commonly known as: SEROQUEL Take 1 tablet (  200 mg total) by mouth at bedtime as needed (sleep).   rizatriptan 10 MG disintegrating tablet Commonly known as: Maxalt-MLT Take 1 tablet (10 mg total) by mouth as needed for migraine (May repeat in 2 hours.  Maximum 2 tablets in 24 hours). May repeat in 2 hours if needed        Exam BP 114/72 (BP Location: Left Arm, Patient Position: Sitting, Cuff Size: Normal)   Pulse 71   Temp 98.3 F (36.8 C) (Oral)   Ht 5\' 2"  (1.575 m)   Wt 117 lb (53.1 kg)   SpO2 98%   BMI 21.40 kg/m  General:  well developed, well nourished, in no apparent distress Heart: RRR Lungs:  CTAB. No respiratory  distress Psych: well oriented with normal range of affect and age-appropriate judgement/insight, alert and oriented x4.  Assessment and Plan  Bipolar 1 disorder (HCC)  ADHD (attention deficit hyperactivity disorder), inattentive type - Plan: amphetamine-dextroamphetamine (ADDERALL XR) 20 MG 24 hr capsule, amphetamine-dextroamphetamine (ADDERALL XR) 20 MG 24 hr capsule  Chronic, stable. Cont Seroquel 200 mg qhs, Lamictal 100 mg bid, BuSpar 7.5 mg bid.  Chronic, unstable. Restart Adderall XR 20 mg/d prn.  F/u in 6 mo or prn. The patient voiced understanding and agreement to the plan.  [place], DO 10/21/22 1:04 PM

## 2022-11-11 ENCOUNTER — Encounter: Payer: Self-pay | Admitting: Family Medicine

## 2022-11-11 ENCOUNTER — Ambulatory Visit (HOSPITAL_BASED_OUTPATIENT_CLINIC_OR_DEPARTMENT_OTHER)
Admission: RE | Admit: 2022-11-11 | Discharge: 2022-11-11 | Disposition: A | Discharge status: Home / Self Care | Payer: Medicaid Other | Source: Ambulatory Visit | Attending: Family Medicine | Admitting: Family Medicine

## 2022-11-11 ENCOUNTER — Ambulatory Visit (INDEPENDENT_AMBULATORY_CARE_PROVIDER_SITE_OTHER): Payer: Medicaid Other | Admitting: Family Medicine

## 2022-11-11 VITALS — BP 122/70 | HR 73 | Temp 98.3°F | Ht 62.0 in | Wt 118.1 lb

## 2022-11-11 DIAGNOSIS — M79645 Pain in left finger(s): Secondary | ICD-10-CM | POA: Diagnosis not present

## 2022-11-11 MED ORDER — TRAMADOL HCL 50 MG PO TABS: 50.0000 mg | ORAL_TABLET | Freq: Three times a day (TID) | ORAL | 0 refills | Status: DC | PRN

## 2022-11-11 NOTE — Progress Notes (Signed)
Musculoskeletal Exam  Patient: Autumn Cortez DOB: August 12, 2001  DOS: 11/11/2022  SUBJECTIVE:  Chief Complaint:   Chief Complaint  Patient presents with   left middle finger injury    Autumn Cortez is a 21 y.o.  female for evaluation and treatment of finger pain.   Onset:  1 day ago. Hit with a puck Location: L middle finger Character:  sharp  Progression of issue:  is unchanged Associated symptoms: swelling, bruising No redness, decreased ROm Treatment: to date has been OTC NSAIDS.   Neurovascular symptoms: no  Past Medical History:  Diagnosis Date   ADD (attention deficit disorder with hyperactivity)    Anxiety    Asthma    Bipolar 1 disorder (HCC)    Migraines     Objective: VITAL SIGNS: BP 122/70 (BP Location: Left Arm, Patient Position: Sitting, Cuff Size: Normal)   Pulse 73   Temp 98.3 F (36.8 C) (Oral)   Ht 5\' 2"  (1.575 m)   Wt 118 lb 2 oz (53.6 kg)   SpO2 99%   BMI 21.61 kg/m  Constitutional: Well formed, well developed. No acute distress. Thorax & Lungs: No accessory muscle use Musculoskeletal: L 3rd digit.   Normal active range of motion: yes.   Normal passive range of motion: yes Tenderness to palpation: Yes over the distal phalanx mainly, slight TTP over the middle and proximal phalanx Deformity: no Ecchymosis: no Neurologic: Decreased sensation to light touch over the tip of the third left digit Psychiatric: Normal mood. Age appropriate judgment and insight. Alert & oriented x 3.    Assessment:  Finger pain, left - Plan: traMADol (ULTRAM) 50 MG tablet, DG Finger Middle Left  Plan: Stretches/exercises, heat, ice, Tylenol.  Tramadol for breakthrough pain.  Check x-ray.  If involving the joint, will refer to a specialist. F/u as originally scheduled. The patient voiced understanding and agreement to the plan.   [place], DO 11/11/22  12:10 PM

## 2022-11-11 NOTE — Patient Instructions (Addendum)
Ice/cold pack over area for 10-15 min twice daily.  OK to take Tylenol 1000 mg (2 extra strength tabs) or 975 mg (3 regular strength tabs) every 6 hours as needed.  Ibuprofen 400-600 mg (2-3 over the counter strength tabs) every 6 hours as needed for pain.  Give [place] 1-2 days to get the results of your X-ray back.   Do not drink alcohol, do any illicit/street drugs, drive or do anything that requires alertness while on this medicine.   Wear the splint at night and during activities.   Let us know if you need anything.

## 2022-11-12 ENCOUNTER — Encounter: Payer: Self-pay | Admitting: Family Medicine

## 2022-11-12 ENCOUNTER — Other Ambulatory Visit: Payer: Self-pay | Admitting: Family Medicine

## 2022-11-12 MED ORDER — HYDROCODONE-ACETAMINOPHEN 5-325 MG PO TABS: 1.0000 | ORAL_TABLET | Freq: Four times a day (QID) | ORAL | 0 refills | Status: AC | PRN

## 2022-11-17 ENCOUNTER — Encounter: Payer: Self-pay | Admitting: Family Medicine
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
## 2023-01-05 ENCOUNTER — Encounter: Payer: Self-pay | Admitting: Neurology

## 2023-01-05 ENCOUNTER — Ambulatory Visit (INDEPENDENT_AMBULATORY_CARE_PROVIDER_SITE_OTHER): Payer: BC Managed Care – PPO | Admitting: Neurology

## 2023-01-05 VITALS — BP 132/95 | HR 110 | Resp 18 | Ht 62.0 in | Wt 113.0 lb

## 2023-01-05 DIAGNOSIS — G43009 Migraine without aura, not intractable, without status migrainosus: Secondary | ICD-10-CM

## 2023-01-05 MED ORDER — EMGALITY 120 MG/ML ~~LOC~~ SOAJ: 240.0000 mg | Freq: Once | SUBCUTANEOUS | 0 refills | Status: AC

## 2023-01-05 NOTE — Patient Instructions (Signed)
Start [employer].  2 injections for first dose, then 1 injection every 28 days thereafter Rizatriptan as needed.  Limit use of pain relievers to no more than 2 days out of week to prevent risk of rebound or medication-overuse headache. Keep headache diary Follow up 6 months.

## 2023-01-11 ENCOUNTER — Other Ambulatory Visit: Payer: Self-pay | Admitting: Family Medicine

## 2023-01-11 DIAGNOSIS — J0140 Acute pansinusitis, unspecified: Secondary | ICD-10-CM

## 2023-01-12 ENCOUNTER — Telehealth: Payer: Self-pay | Admitting: Pharmacy Technician

## 2023-01-12 NOTE — Telephone Encounter (Signed)
Patient Advocate Encounter   Received notification that prior authorization for Emgality 120MG /ML auto-injectors (migraine) is required.   PA submitted on 01/12/2023 Key [id number] Status is pending       Lyndel Safe, [place] Patient Advocate Specialist [place] Patient Advocate Team Direct Number: [phone]  Fax: [phone]

## 2023-01-13 ENCOUNTER — Encounter: Payer: Self-pay | Admitting: Neurology

## 2023-01-14 ENCOUNTER — Telehealth: Payer: Self-pay

## 2023-01-14 MED ORDER — QULIPTA 60 MG PO TABS: 60.0000 mg | ORAL_TABLET | Freq: Every day | ORAL | 1 refills | Status: DC

## 2023-01-14 NOTE — Telephone Encounter (Signed)
Patient is returning a call to [facility].

## 2023-01-14 NOTE — Telephone Encounter (Signed)
Patient and mother advised of Dr.jaffe,  If she can get the [place] now, then let's do it.  Cancel PA for [employer].  Please send prescription in for Qulipta 60mg  daily.

## 2023-01-14 NOTE — Telephone Encounter (Signed)
Per Patient her mother advised her that the insurance covers [place] without a Prior approval. So if we could go ahead and send that in instead of her waiting on the PA for Emgaltiy.  Advised Patient I will relay this to [facility] and see how he feels but I will also keep a look out fot the results of the PA emgaltiy and let her know if it will be covered.

## 2023-01-15 ENCOUNTER — Other Ambulatory Visit (HOSPITAL_COMMUNITY): Payer: Self-pay

## 2023-01-15 NOTE — Telephone Encounter (Signed)
Patient Advocate Encounter  Prior Authorization for Emgality 120MG /ML auto-injectors (migraine)  has been approved through [employer].  Key [id number]     Effective: 01-12-2023 to 04-12-2023

## 2023-01-16 NOTE — Telephone Encounter (Signed)
Patient advised.

## 2023-01-21 ENCOUNTER — Encounter: Payer: Self-pay | Admitting: Family Medicine

## 2023-01-22 ENCOUNTER — Other Ambulatory Visit: Payer: Self-pay | Admitting: Family Medicine

## 2023-01-22 DIAGNOSIS — Z8481 Family history of carrier of genetic disease: Secondary | ICD-10-CM

## 2023-01-23 ENCOUNTER — Ambulatory Visit: Payer: BC Managed Care – PPO | Admitting: Family Medicine

## 2023-01-23 ENCOUNTER — Other Ambulatory Visit (INDEPENDENT_AMBULATORY_CARE_PROVIDER_SITE_OTHER): Payer: BC Managed Care – PPO

## 2023-01-23 DIAGNOSIS — Z8481 Family history of carrier of genetic disease: Secondary | ICD-10-CM

## 2023-01-23 NOTE — Addendum Note (Signed)
Addended by: Manuela Schwartz on: 01/23/2023 11:15 AM   Modules accepted: Orders

## 2023-01-26 ENCOUNTER — Encounter: Payer: Self-pay | Admitting: Family Medicine

## 2023-01-26 ENCOUNTER — Ambulatory Visit: Payer: BC Managed Care – PPO | Admitting: Family Medicine

## 2023-01-30 ENCOUNTER — Telehealth: Payer: Self-pay

## 2023-01-30 ENCOUNTER — Other Ambulatory Visit (HOSPITAL_COMMUNITY): Payer: Self-pay

## 2023-01-30 NOTE — Telephone Encounter (Signed)
Awaiting response. Qulipta requires PA, Emgality has been approved.

## 2023-02-02 ENCOUNTER — Encounter: Payer: Self-pay | Admitting: Neurology

## 2023-02-02 ENCOUNTER — Telehealth: Payer: Self-pay | Admitting: Family Medicine

## 2023-02-02 NOTE — Telephone Encounter (Signed)
Patient said she had labs done on the 26th and was advised it would only be 24-48 hours for them to come back but it's been a week and she hasn't heard anything. Please call patient to advise.

## 2023-02-02 NOTE — Telephone Encounter (Signed)
The lab says can take any where  18-21 days Patient informed

## 2023-02-02 NOTE — Telephone Encounter (Signed)
Per Patient mychart message.  Hello, I have picked up the qulipta and I would like to stay on it.

## 2023-02-02 NOTE — Telephone Encounter (Signed)
Patient informed of PCP instructions. Sent a message to lab to question turn around time.

## 2023-02-06 ENCOUNTER — Encounter: Payer: Self-pay | Admitting: Family Medicine

## 2023-02-12 LAB — BRCASSURE COMPREHENSIVE PANEL

## 2023-02-13 ENCOUNTER — Other Ambulatory Visit (HOSPITAL_COMMUNITY): Payer: Self-pay

## 2023-02-13 NOTE — Telephone Encounter (Signed)
PA for Autumn Cortez has been submitted via CMM to [employer] and is pending determination.   Key: [id number]

## 2023-02-20 ENCOUNTER — Encounter: Payer: Self-pay | Admitting: Neurology

## 2023-02-21 ENCOUNTER — Other Ambulatory Visit: Payer: Self-pay | Admitting: Family Medicine

## 2023-02-21 DIAGNOSIS — Z3041 Encounter for surveillance of contraceptive pills: Secondary | ICD-10-CM

## 2023-02-25 LAB — BRCASSURE COMPREHENSIVE PANEL

## 2023-02-25 NOTE — Telephone Encounter (Signed)
The insurance Denied original.  Resubmitted in hopes to get it approved.  Patient Advocate Encounter   Received notification that prior authorization for Qulipta '60MG'$  tablets is required.   PA submitted on 02/25/2023 Key [id number] Insurance [employer] Electronic Prior Authorization Form Status is pending       Lyndel Safe, [place] Patient Advocate Specialist [place] Patient Advocate Team Direct Number: [phone]  Fax: [phone]

## 2023-02-26 ENCOUNTER — Telehealth: Payer: Self-pay

## 2023-02-26 NOTE — Telephone Encounter (Signed)
Patient advised script wasn't cancelled the PA was in hope of trying to get it approved verse it staying denied.

## 2023-03-02 NOTE — Telephone Encounter (Signed)
Patient Advocate Encounter  Prior Authorization for Qulipta '60MG'$  tablets has been approved.    PA# [id number] Insurance [employer] Electronic Prior Authorization Form Effective dates: 02/25/2023 through 05/26/2023      Lyndel Safe, [place] Patient Advocate Specialist [place] Patient Advocate Team Direct Number: [phone]  Fax: [phone]

## 2023-03-03 NOTE — Telephone Encounter (Signed)
Patient advised.

## 2023-03-11 ENCOUNTER — Other Ambulatory Visit (HOSPITAL_COMMUNITY): Payer: Self-pay

## 2023-04-03 ENCOUNTER — Ambulatory Visit: Payer: BC Managed Care – PPO | Admitting: Family Medicine

## 2023-04-03 ENCOUNTER — Ambulatory Visit (INDEPENDENT_AMBULATORY_CARE_PROVIDER_SITE_OTHER): Payer: BC Managed Care – PPO | Admitting: Family Medicine

## 2023-04-03 ENCOUNTER — Encounter: Payer: Self-pay | Admitting: Family Medicine

## 2023-04-03 VITALS — BP 118/70 | HR 97 | Temp 97.9°F | Ht 62.0 in | Wt 115.2 lb

## 2023-04-03 DIAGNOSIS — T753XXA Motion sickness, initial encounter: Secondary | ICD-10-CM

## 2023-04-03 NOTE — Progress Notes (Signed)
Chief Complaint  Patient presents with   car sickness    Subjective: Patient is a 22 y.o. female here for motion sickness.  Over the past 2 weeks, has been having more motion/car sickness. Does not happen when she drives. It only happens when she is a passenger, even in the front seat. Historically, it would only happen when she would read a book. She gets nausea and a headache as her symptoms. Has not tried anything at home so far. Stays hydrated.   Past Medical History:  Diagnosis Date   ADD (attention deficit disorder with hyperactivity)    Anxiety    Asthma    Bipolar 1 disorder    Migraines     Objective: BP 118/70 (BP Location: Left Arm, Patient Position: Sitting, Cuff Size: Normal)   Pulse 97   Temp 97.9 F (36.6 C) (Oral)   Ht 5\' 2"  (1.575 m)   Wt 115 lb 4 oz (52.3 kg)   SpO2 99%   BMI 21.08 kg/m  General: Awake, appears stated age Heart: RRR, no LE edema Lungs: CTAB, no rales, wheezes or rhonchi. No accessory muscle use Mouth: MMM Neuro: Gait is nml, no cerebellar signs Abd: BS+, S, NT, ND Psych: Age appropriate judgment and insight, normal affect and mood  Assessment and Plan: Car sickness, initial encounter  Fortunately symptoms seem relatively mild as do not happen when she is driving.  Recommend she try to be the one to drive otherwise sit in the front seat and looks out into the horizon.  Could consider ginger chews or a pressure-point wrist bracelet.  She could also consider Allegra 180 mg daily for some antihistamine effect without drowsiness.  Could consider Dramamine for longer drives.  We can even consider scopolamine patches.  She will see how the above options go first. The patient voiced understanding and agreement to the plan.  Jilda Roche [place], DO 04/03/23  2:03 PM

## 2023-04-03 NOTE — Patient Instructions (Signed)
Try to stay in the front seat or drive. Looking into the horizaon or further out can be helpful.   Consider ginger chews (OTC) as needed.  Consider Allegra 180 mg daily for a couple weeks to see if that helps.   Could consider dramamine if on a longer ride and you can conk out.   Consider wrist pressure point bracelets (doesn't have a lot of data but can be helpful for some).  Let us know if you need anything.

## 2023-04-18 ENCOUNTER — Other Ambulatory Visit: Payer: Self-pay | Admitting: Family Medicine

## 2023-04-18 DIAGNOSIS — J0140 Acute pansinusitis, unspecified: Secondary | ICD-10-CM

## 2023-04-22 ENCOUNTER — Encounter: Payer: Self-pay | Admitting: Family Medicine

## 2023-04-22 ENCOUNTER — Ambulatory Visit (INDEPENDENT_AMBULATORY_CARE_PROVIDER_SITE_OTHER): Payer: Medicaid Other | Admitting: Family Medicine

## 2023-04-22 VITALS — BP 102/65 | HR 84 | Temp 98.9°F | Ht 62.0 in | Wt 111.4 lb

## 2023-04-22 DIAGNOSIS — F315 Bipolar disorder, current episode depressed, severe, with psychotic features: Secondary | ICD-10-CM

## 2023-04-22 DIAGNOSIS — Z13220 Encounter for screening for lipoid disorders: Secondary | ICD-10-CM | POA: Diagnosis not present

## 2023-04-22 DIAGNOSIS — Z0000 Encounter for general adult medical examination without abnormal findings: Secondary | ICD-10-CM | POA: Diagnosis not present

## 2023-04-22 DIAGNOSIS — F333 Major depressive disorder, recurrent, severe with psychotic symptoms: Secondary | ICD-10-CM

## 2023-04-22 DIAGNOSIS — R110 Nausea: Secondary | ICD-10-CM

## 2023-04-22 DIAGNOSIS — F319 Bipolar disorder, unspecified: Secondary | ICD-10-CM

## 2023-04-22 LAB — COMPREHENSIVE METABOLIC PANEL
ALT: 10 U/L (ref 0–35)
AST: 13 U/L (ref 0–37)
Albumin: 4.1 g/dL (ref 3.5–5.2)
Alkaline Phosphatase: 54 U/L (ref 39–117)
BUN: 8 mg/dL (ref 6–23)
CO2: 26 mEq/L (ref 19–32)
Calcium: 9.3 mg/dL (ref 8.4–10.5)
Chloride: 105 mEq/L (ref 96–112)
Creatinine, Ser: 0.85 mg/dL (ref 0.40–1.20)
GFR: 98.3 mL/min (ref >60.00)
Glucose, Bld: 112 mg/dL — ABNORMAL HIGH (ref 70–99)
Potassium: 3.5 mEq/L (ref 3.5–5.1)
Sodium: 142 mEq/L (ref 135–145)
Total Bilirubin: 0.6 mg/dL (ref 0.2–1.2)
Total Protein: 6.4 g/dL (ref 6.0–8.3)

## 2023-04-22 LAB — LIPID PANEL
Cholesterol: 161 mg/dL (ref 0–200)
HDL: 60.9 mg/dL (ref >39.00)
LDL Cholesterol: 77 mg/dL (ref 0–99)
NonHDL: 99.85
Total CHOL/HDL Ratio: 3
Triglycerides: 113 mg/dL (ref 0.0–149.0)
VLDL: 22.6 mg/dL (ref 0.0–40.0)

## 2023-04-22 LAB — CBC
HCT: 42.8 % (ref 36.0–46.0)
Hemoglobin: 14.6 g/dL (ref 12.0–15.0)
MCHC: 34.1 g/dL (ref 30.0–36.0)
MCV: 87.3 fl (ref 78.0–100.0)
Platelets: 273 10*3/uL (ref 150.0–400.0)
RBC: 4.91 Mil/uL (ref 3.87–5.11)
RDW: 12.1 % (ref 11.5–14.6)
WBC: 9.2 10*3/uL (ref 4.5–10.5)

## 2023-04-22 LAB — HCG, QUANTITATIVE, PREGNANCY: Quantitative HCG: <0.6 m[IU]/mL

## 2023-04-22 MED ORDER — BUPROPION HCL ER (SMOKING DET) 150 MG PO TB12: ORAL_TABLET | ORAL | 2 refills | Status: DC

## 2023-04-22 NOTE — Progress Notes (Signed)
Chief Complaint  Patient presents with   Annual Exam     Well Woman Autumn Cortez is here for a complete physical.   Her last physical was >1 year ago.  Current diet: in general, diet could be better. Current exercise: no. Fatigue out of ordinary? rarely Seatbelt? Yes  Health Maintenance Tetanus- Yes HIV screening- Yes Hep C screening- Yes  Bipolar/anxiety/depression History of bipolar disease diagnosed by psychiatry.  Currently managed by me due to stability.  She is compliant with Seroquel 50 mg nightly, BuSpar 7.5 mg twice daily, Lamictal 100 mg twice daily.  She is not following with a therapist right now.  No homicidal or suicidal ideation.  Things have been getting worse over the past couple weeks with depression and anxiety.  She will sometimes have panic attacks though she is vaping so wonders if shortness of breath could be related to that. +stress w family and at work.   Past Medical History:  Diagnosis Date   ADD (attention deficit disorder with hyperactivity)    Anxiety    Asthma    Bipolar 1 disorder    Migraines      Past Surgical History:  Procedure Laterality Date   ADENOIDECTOMY  8/11   TONSILLECTOMY      Medications  Current Outpatient Medications on File Prior to Visit  Medication Sig Dispense Refill   albuterol (VENTOLIN HFA) 108 (90 Base) MCG/ACT inhaler Inhale 2 puffs into the lungs every 6 (six) hours as needed for shortness of breath or wheezing. 18 g 5   amphetamine-dextroamphetamine (ADDERALL XR) 20 MG 24 hr capsule Take 1 capsule (20 mg total) by mouth daily. 30 capsule 0   amphetamine-dextroamphetamine (ADDERALL XR) 20 MG 24 hr capsule Take 1 capsule (20 mg total) by mouth daily. 30 capsule 0   Atogepant (QULIPTA) 60 MG TABS Take 60 mg by mouth daily at 2 PM. 90 tablet 1   budesonide (PULMICORT) 180 MCG/ACT inhaler Inhale 1 puff into the lungs 2 (two) times daily. 3 each 2   busPIRone (BUSPAR) 7.5 MG tablet Take 1 tablet (7.5 mg total)  by mouth 2 (two) times daily. 180 tablet 2   Erenumab-aooe (AIMOVIG) 140 MG/ML SOAJ Inject 140 mg into the skin every 28 (twenty-eight) days. 1.12 mL 4   fluconazole (DIFLUCAN) 150 MG tablet Take 1 tab, repeat in 72 hours if no improvement. 2 tablet 0   fluticasone (FLONASE) 50 MCG/ACT nasal spray Place 2 sprays into both nostrils daily. 16 g 1   gabapentin (NEURONTIN) 300 MG capsule Take 1-2 capsule 3 times daily as needed for panic attacks. 30 capsule 3   lamoTRIgine (LAMICTAL) 100 MG tablet Take 1 tablet (100 mg total) by mouth 2 (two) times daily. 180 tablet 2   montelukast (SINGULAIR) 10 MG tablet TAKE ONE TABLET BY MOUTH EVERY NIGHT AT BEDTIME 30 tablet 3   NIKKI 3-0.02 MG tablet TAKE 1 TABLET BY MOUTH DAILY 84 tablet 3   ondansetron (ZOFRAN ODT) 8 MG disintegrating tablet Take 1 tablet (8 mg total) by mouth every 8 (eight) hours as needed for nausea or vomiting. 20 tablet 5   pantoprazole (PROTONIX) 40 MG tablet Take 1 tablet (40 mg total) by mouth daily. 30 tablet 1   QUEtiapine (SEROQUEL) 200 MG tablet Take 1 tablet (200 mg total) by mouth at bedtime as needed (sleep). 90 tablet 2   rizatriptan (MAXALT-MLT) 10 MG disintegrating tablet Take 1 tablet (10 mg total) by mouth as needed for migraine (May repeat  in 2 hours.  Maximum 2 tablets in 24 hours). May repeat in 2 hours if needed 10 tablet 5   Allergies Allergies  Allergen Reactions   Pollen Extract Other (See Comments)    Seasonal allergies - runny nose, sneezing, watery eyes Congestion and sneezing    Review of Systems: Constitutional:  no unexpected weight changes Eye:  no recent significant change in vision Ear/Nose/Mouth/Throat:  Ears:  no tinnitus or vertigo and no recent change in hearing Nose/Mouth/Throat:  no complaints of nasal congestion, no sore throat Cardiovascular: no chest pain Respiratory:  no cough and no shortness of breath Gastrointestinal:  no abdominal pain, no change in bowel habits; +nausea GU:  Female:  negative for dysuria or pelvic pain Musculoskeletal/Extremities:  no pain of the joints Integumentary (Skin/Breast):  no abnormal skin lesions reported Neurologic:  no headaches Endocrine:  denies fatigue Hematologic/Lymphatic:  No areas of easy bleeding  Exam BP 102/65 (BP Location: Left Arm, Patient Position: Sitting, Cuff Size: Normal)   Pulse 84   Temp 98.9 F (37.2 C) (Oral)   Ht  (1.575 m)   Wt 111 lb 6 oz (50.5 kg)   SpO2 99%   BMI 20.37 kg/m  General:  well developed, well nourished, in no apparent distress Skin:  no significant moles, warts, or growths Head:  no masses, lesions, or tenderness Eyes:  pupils equal and round, sclera anicteric without injection Ears:  canals without lesions, TMs shiny without retraction, no obvious effusion, no erythema Nose:  nares patent, mucosa normal, and no drainage  Throat/Pharynx:  lips and gingiva without lesion; tongue and uvula midline; non-inflamed pharynx; no exudates or postnasal drainage Neck: neck supple without adenopathy, thyromegaly, or masses Lungs:  clear to auscultation, breath sounds equal bilaterally, no respiratory distress Cardio:  regular rate and rhythm, no bruits, no LE edema Abdomen:  abdomen soft, mild ttp in lower quadrants; bowel sounds normal; no masses or organomegaly Genital: Defer to GYN Musculoskeletal:  symmetrical muscle groups noted without atrophy or deformity Extremities:  no clubbing, cyanosis, or edema, no deformities, no skin discoloration Neuro:  gait normal; deep tendon reflexes normal and symmetric Psych: well oriented with normal range of affect and appropriate judgment/insight  Assessment and Plan  Well adult exam - Plan: CBC, Comprehensive metabolic panel, Lipid panel  Bipolar 1 disorder - Plan: buPROPion (ZYBAN) 150 MG 12 hr tablet  MDD (major depressive disorder), recurrent, severe, with psychosis - Plan: buPROPion (ZYBAN) 150 MG 12 hr tablet  Nausea - Plan: hCG, quantitative,  pregnancy   Well 22 y.o. female. Counseled on diet and exercise. Bipolar/depression: Chronic, unstable. Ck pregnancy test. If neg, will cont Lamictal 100 mg bid, Seroquel 50 mg qhs, BuSpar 7.5 mg bid. Add bupropion 150 mg qd for 3 d and then bid. Hopefully this helps w her quitting vaping. Reck in 1 mo.  Other orders as above. The patient voiced understanding and agreement to the plan.  Jilda Roche [place], DO 04/22/23 1:50 PM

## 2023-04-22 NOTE — Patient Instructions (Addendum)
Give us 2-3 business days to get the results of your labs back.   Keep the diet clean and stay active.  Let us know if you need anything. 

## 2023-04-27 ENCOUNTER — Other Ambulatory Visit: Payer: Self-pay | Admitting: Family Medicine

## 2023-04-27 DIAGNOSIS — F410 Panic disorder [episodic paroxysmal anxiety] without agoraphobia: Secondary | ICD-10-CM

## 2023-04-29 ENCOUNTER — Encounter: Payer: Self-pay | Admitting: Family Medicine

## 2023-04-29 ENCOUNTER — Ambulatory Visit (INDEPENDENT_AMBULATORY_CARE_PROVIDER_SITE_OTHER): Payer: BC Managed Care – PPO | Admitting: Family Medicine

## 2023-04-29 VITALS — BP 104/72 | HR 90 | Temp 98.4°F | Ht 62.0 in | Wt 109.2 lb

## 2023-04-29 DIAGNOSIS — J0110 Acute frontal sinusitis, unspecified: Secondary | ICD-10-CM | POA: Diagnosis not present

## 2023-04-29 MED ORDER — AMOXICILLIN-POT CLAVULANATE 875-125 MG PO TABS
1.0000 | ORAL_TABLET | Freq: Two times a day (BID) | ORAL | 0 refills | Status: AC
Start: 2023-05-02 — End: 2023-05-09

## 2023-04-29 MED ORDER — PREDNISONE 20 MG PO TABS
40.0000 mg | ORAL_TABLET | Freq: Every day | ORAL | 0 refills | Status: AC
Start: 2023-04-29 — End: 2023-05-04

## 2023-04-29 NOTE — Patient Instructions (Signed)
Continue to push fluids, practice good hand hygiene, and cover your mouth if you cough. ? ?If you start having fevers, shaking or shortness of breath, seek immediate care. ? ?OK to take Tylenol 1000 mg (2 extra strength tabs) or 975 mg (3 regular strength tabs) every 6 hours as needed. ? ?Let us know if you need anything. ?

## 2023-04-29 NOTE — Progress Notes (Signed)
Chief Complaint  Patient presents with   Cough    Congestion Ear pain     Autumn Cortez here for URI complaints.  Duration: 1 week - getting worse over past 3-4 d Associated symptoms: sinus congestion, sinus pain, rhinorrhea, ear fullness, ear pain, sore throat, shortness of breath, and coughing Denies: itchy watery eyes, ear drainage, wheezing, myalgia, and fevers Treatment to date: Dayquil, Allegra Sick contacts: No  Past Medical History:  Diagnosis Date   ADD (attention deficit disorder with hyperactivity)    Anxiety    Asthma    Bipolar 1 disorder (HCC)    Migraines     Objective BP 104/72 (BP Location: Left Arm, Patient Position: Sitting, Cuff Size: Normal)   Pulse 90   Temp 98.4 F (36.9 C) (Oral)   Ht 5\' 2"  (1.575 m)   Wt 109 lb 4 oz (49.6 kg)   SpO2 96%   BMI 19.98 kg/m  General: Awake, alert, appears stated age HEENT: AT, [place], ears patent b/l and TM's neg, nares patent w/o discharge, pharynx pink and without exudates, MMM, +TTp over frontal sinuses b/l Neck: No masses or asymmetry Heart: RRR Lungs: CTAB, no accessory muscle use Psych: Age appropriate judgment and insight, normal mood and affect  Acute frontal sinusitis, recurrence not specified - Plan: predniSONE (DELTASONE) 20 MG tablet, amoxicillin-clavulanate (AUGMENTIN) 875-125 MG tablet  5 d pred burst 40 mg/d, if no better in 2-3 d, will take 7 d of Augmentin. Letter for work given. Continue to push fluids, practice good hand hygiene, cover mouth when coughing. F/u prn. If starting to experience fevers, shaking, or shortness of breath, seek immediate care. Pt voiced understanding and agreement to the plan.  Jilda Roche [place], DO 04/29/23 7:26 AM

## 2023-04-30 ENCOUNTER — Emergency Department (HOSPITAL_COMMUNITY)
Admission: EM | Admit: 2023-04-30 | Discharge: 2023-04-30 | Disposition: A | Discharge status: Home / Self Care | Payer: Medicaid Other | Attending: Emergency Medicine | Admitting: Emergency Medicine

## 2023-04-30 ENCOUNTER — Encounter (HOSPITAL_COMMUNITY): Payer: Self-pay

## 2023-04-30 ENCOUNTER — Other Ambulatory Visit: Payer: Self-pay

## 2023-04-30 ENCOUNTER — Emergency Department (HOSPITAL_COMMUNITY): Payer: Medicaid Other

## 2023-04-30 DIAGNOSIS — R1011 Right upper quadrant pain: Secondary | ICD-10-CM | POA: Insufficient documentation

## 2023-04-30 DIAGNOSIS — R1013 Epigastric pain: Secondary | ICD-10-CM | POA: Diagnosis not present

## 2023-04-30 DIAGNOSIS — R1010 Upper abdominal pain, unspecified: Secondary | ICD-10-CM | POA: Diagnosis not present

## 2023-04-30 DIAGNOSIS — R110 Nausea: Secondary | ICD-10-CM | POA: Insufficient documentation

## 2023-04-30 DIAGNOSIS — R1084 Generalized abdominal pain: Secondary | ICD-10-CM | POA: Diagnosis not present

## 2023-04-30 LAB — COMPREHENSIVE METABOLIC PANEL
ALT: 19 U/L (ref 0–44)
AST: 20 U/L (ref 15–41)
Albumin: 3.9 g/dL (ref 3.5–5.0)
Alkaline Phosphatase: 58 U/L (ref 38–126)
Anion gap: 11 (ref 5–15)
BUN: 7 mg/dL (ref 6–20)
CO2: 23 mmol/L (ref 22–32)
Calcium: 9.7 mg/dL (ref 8.9–10.3)
Chloride: 104 mmol/L (ref 98–111)
Creatinine, Ser: 0.91 mg/dL (ref 0.44–1.00)
GFR, Estimated: >60 mL/min (ref >60)
Glucose, Bld: 125 mg/dL — ABNORMAL HIGH (ref 70–99)
Potassium: 3.8 mmol/L (ref 3.5–5.1)
Sodium: 138 mmol/L (ref 135–145)
Total Bilirubin: 0.7 mg/dL (ref 0.3–1.2)
Total Protein: 6.6 g/dL (ref 6.5–8.1)

## 2023-04-30 LAB — PREGNANCY, URINE: Preg Test, Ur: NEGATIVE

## 2023-04-30 LAB — CBC WITH DIFFERENTIAL/PLATELET
Abs Immature Granulocytes: 0.03 10*3/uL (ref 0.00–0.07)
Basophils Absolute: 0 10*3/uL (ref 0.0–0.1)
Basophils Relative: 0 %
Eosinophils Absolute: 0 10*3/uL (ref 0.0–0.5)
Eosinophils Relative: 0 %
HCT: 42.1 % (ref 36.0–46.0)
Hemoglobin: 14.4 g/dL (ref 12.0–15.0)
Immature Granulocytes: 0 %
Lymphocytes Relative: 9 %
Lymphs Abs: 0.7 10*3/uL (ref 0.7–4.0)
MCH: 29.2 pg (ref 26.0–34.0)
MCHC: 34.2 g/dL (ref 30.0–36.0)
MCV: 85.4 fL (ref 80.0–100.0)
Monocytes Absolute: 0.1 10*3/uL (ref 0.1–1.0)
Monocytes Relative: 1 %
Neutro Abs: 6.9 10*3/uL (ref 1.7–7.7)
Neutrophils Relative %: 90 %
Platelets: 261 10*3/uL (ref 150–400)
RBC: 4.93 MIL/uL (ref 3.87–5.11)
RDW: 11.6 % (ref 11.5–15.5)
WBC: 7.8 10*3/uL (ref 4.0–10.5)
nRBC: 0 % (ref 0.0–0.2)

## 2023-04-30 LAB — LIPASE, BLOOD: Lipase: 27 U/L (ref 11–51)

## 2023-04-30 LAB — URINALYSIS, ROUTINE W REFLEX MICROSCOPIC
Bilirubin Urine: NEGATIVE
Glucose, UA: NEGATIVE mg/dL
Hgb urine dipstick: NEGATIVE
Ketones, ur: NEGATIVE mg/dL
Leukocytes,Ua: NEGATIVE
Nitrite: NEGATIVE
Protein, ur: NEGATIVE mg/dL
Specific Gravity, Urine: 1.004 — ABNORMAL LOW (ref 1.005–1.030)
pH: 6 (ref 5.0–8.0)

## 2023-04-30 MED ORDER — PANTOPRAZOLE SODIUM 40 MG IV SOLR
40.0000 mg | Freq: Once | INTRAVENOUS | Status: AC
  Administered 2023-04-30: 40 mg via INTRAVENOUS
  Filled 2023-04-30: qty 10

## 2023-04-30 MED ORDER — DROPERIDOL 2.5 MG/ML IJ SOLN
1.2500 mg | Freq: Once | INTRAMUSCULAR | Status: AC
  Administered 2023-04-30: 1.25 mg via INTRAVENOUS
  Filled 2023-04-30: qty 2

## 2023-04-30 MED ORDER — OMEPRAZOLE 20 MG PO CPDR: 20.0000 mg | DELAYED_RELEASE_CAPSULE | Freq: Every day | ORAL | 0 refills | Status: DC

## 2023-04-30 MED ORDER — ONDANSETRON HCL 4 MG/2ML IJ SOLN
4.0000 mg | Freq: Once | INTRAMUSCULAR | Status: AC
  Administered 2023-04-30: 4 mg via INTRAVENOUS
  Filled 2023-04-30: qty 2

## 2023-04-30 MED ORDER — MORPHINE SULFATE (PF) 4 MG/ML IV SOLN
4.0000 mg | Freq: Once | INTRAVENOUS | Status: AC
  Administered 2023-04-30: 4 mg via INTRAVENOUS
  Filled 2023-04-30: qty 1

## 2023-04-30 MED ORDER — SODIUM CHLORIDE 0.9 % IV BOLUS
1000.0000 mL | Freq: Once | INTRAVENOUS | Status: AC
  Administered 2023-04-30: 1000 mL via INTRAVENOUS

## 2023-04-30 MED ORDER — KETOROLAC TROMETHAMINE 15 MG/ML IJ SOLN
15.0000 mg | Freq: Once | INTRAMUSCULAR | Status: AC
  Administered 2023-04-30: 15 mg via INTRAVENOUS
  Filled 2023-04-30: qty 1

## 2023-04-30 MED ORDER — IOHEXOL 350 MG/ML SOLN
75.0000 mL | Freq: Once | INTRAVENOUS | Status: AC | PRN
  Administered 2023-04-30: 75 mL via INTRAVENOUS

## 2023-04-30 MED ORDER — ONDANSETRON HCL 4 MG PO TABS: 4.0000 mg | ORAL_TABLET | Freq: Four times a day (QID) | ORAL | 0 refills | Status: AC

## 2023-04-30 MED ORDER — ONDANSETRON HCL 4 MG PO TABS: 4.0000 mg | ORAL_TABLET | Freq: Four times a day (QID) | ORAL | 0 refills | Status: DC

## 2023-04-30 NOTE — ED Provider Notes (Signed)
[place] EMERGENCY DEPARTMENT AT [facility] Provider Note   CSN: [id number] Arrival date & time: 04/30/23  1006     History  Chief Complaint  Patient presents with   Abdominal Pain    Autumn Cortez is a 22 y.o. female.  23 year old female with no past medical history presents to the ED with a chief complaint of epigastric, right upper quadrant pain which has been ongoing since yesterday.  Patient was evaluated by her primary care physician the prior day, placed on prednisone along with amoxicillin due to a sinus infection after having symptoms for about 1 week.  At today's visit, patient reports he feels that the epigastric pain along with right upper quadrant pain have now worsened.  Somewhat alleviated by placing ice to the area, no exacerbating factors.  Last oral intake was yesterday as she tried to eat some pizza, however she reports she "does not have the appetite to eat, and feel that she is very hungry ".  Her last menstrual cycle began recently, she has not had any urinary symptoms.  Has not taking any medication for improvement in symptoms aside from the prednisone.  No prior surgical intervention of her abdomen.  No fevers, no vomiting.  The history is provided by the patient and medical records.  Abdominal Pain Associated symptoms: nausea   Associated symptoms: no chest pain, no chills, no diarrhea, no fever, no vaginal bleeding, no vaginal discharge and no vomiting        Home Medications Prior to Admission medications   Medication Sig Start Date End Date Taking? Authorizing Provider  omeprazole (PRILOSEC) 20 MG capsule Take 1 capsule (20 mg total) by mouth daily for 7 days. 04/30/23 05/07/23 Yes Rosaura Bolon, PA-C  ondansetron (ZOFRAN) 4 MG tablet Take 1 tablet (4 mg total) by mouth every 6 (six) hours for 7 days. 04/30/23 05/07/23 Yes Keaton Stirewalt, Leonie Douglas, PA-C  albuterol (VENTOLIN HFA) 108 (90 Base) MCG/ACT inhaler Inhale 2 puffs into the lungs every 6 (six) hours  as needed for shortness of breath or wheezing. 09/19/22   Sharlene Dory, DO  amoxicillin-clavulanate (AUGMENTIN) 875-125 MG tablet Take 1 tablet by mouth 2 (two) times daily for 7 days. 05/02/23 05/09/23  Sharlene Dory, DO  amphetamine-dextroamphetamine (ADDERALL XR) 20 MG 24 hr capsule Take 1 capsule (20 mg total) by mouth daily. 10/21/22   Sharlene Dory, DO  amphetamine-dextroamphetamine (ADDERALL XR) 20 MG 24 hr capsule Take 1 capsule (20 mg total) by mouth daily. 11/20/22   Wendling, Jilda Roche, DO  Atogepant (QULIPTA) 60 MG TABS Take 60 mg by mouth daily at 2 PM. 01/14/23   Everlena Cooper, Adam R, DO  budesonide (PULMICORT) 180 MCG/ACT inhaler Inhale 1 puff into the lungs 2 (two) times daily. 03/12/21   Sharlene Dory, DO  buPROPion (ZYBAN) 150 MG 12 hr tablet 1 week before quit date, take 1 tab daily for 3 days and then 1 tab twice daily. 04/22/23   Sharlene Dory, DO  busPIRone (BUSPAR) 7.5 MG tablet TAKE 1 TABLET BY MOUTH TWICE A DAY 04/27/23   Wendling, Jilda Roche, DO  Erenumab-aooe (AIMOVIG) 140 MG/ML SOAJ Inject 140 mg into the skin every 28 (twenty-eight) days. 08/11/22   Drema Dallas, DO  fluconazole (DIFLUCAN) 150 MG tablet Take 1 tab, repeat in 72 hours if no improvement. 09/23/22   Sharlene Dory, DO  fluticasone (FLONASE) 50 MCG/ACT nasal spray Place 2 sprays into both nostrils daily. 04/16/20   Worthy Rancher  B, FNP  gabapentin (NEURONTIN) 300 MG capsule Take 1-2 capsule 3 times daily as needed for panic attacks. 09/18/21   Sharlene Dory, DO  lamoTRIgine (LAMICTAL) 100 MG tablet Take 1 tablet (100 mg total) by mouth 2 (two) times daily. 10/21/22   Sharlene Dory, DO  montelukast (SINGULAIR) 10 MG tablet TAKE ONE TABLET BY MOUTH EVERY NIGHT AT BEDTIME 04/20/23   Wendling, Jilda Roche, DO  NIKKI 3-0.02 MG tablet TAKE 1 TABLET BY MOUTH DAILY 02/23/23   Sharlene Dory, DO  ondansetron (ZOFRAN ODT) 8 MG disintegrating  tablet Take 1 tablet (8 mg total) by mouth every 8 (eight) hours as needed for nausea or vomiting. 05/30/21   Everlena Cooper, Adam R, DO  pantoprazole (PROTONIX) 40 MG tablet Take 1 tablet (40 mg total) by mouth daily. 04/22/22   Sharlene Dory, DO  predniSONE (DELTASONE) 20 MG tablet Take 2 tablets (40 mg total) by mouth daily with breakfast for 5 days. 04/29/23 05/04/23  Sharlene Dory, DO  QUEtiapine (SEROQUEL) 200 MG tablet Take 1 tablet (200 mg total) by mouth at bedtime as needed (sleep). 04/21/22   Sharlene Dory, DO  rizatriptan (MAXALT-MLT) 10 MG disintegrating tablet Take 1 tablet (10 mg total) by mouth as needed for migraine (May repeat in 2 hours.  Maximum 2 tablets in 24 hours). May repeat in 2 hours if needed 05/30/21   Drema Dallas, DO      Allergies    Pollen extract    Review of Systems   Review of Systems  Constitutional:  Negative for chills and fever.  Cardiovascular:  Negative for chest pain.  Gastrointestinal:  Positive for abdominal pain and nausea. Negative for diarrhea and vomiting.  Genitourinary:  Negative for difficulty urinating, flank pain, vaginal bleeding and vaginal discharge.  Musculoskeletal:  Negative for back pain.  All other systems reviewed and are negative.   Physical Exam Updated Vital Signs BP (!) 135/98   Pulse 70   Temp 98.1 F (36.7 C) (Oral)   Resp 17   Ht 5\' 2"  (1.575 m)   Wt 49.4 kg   SpO2 100%   BMI 19.94 kg/m  Physical Exam Vitals and nursing note reviewed.  Constitutional:      General: She is not in acute distress.    Appearance: She is well-developed.  HENT:     Head: Normocephalic and atraumatic.     Mouth/Throat:     Pharynx: No oropharyngeal exudate.  Eyes:     Pupils: Pupils are equal, round, and reactive to light.  Cardiovascular:     Rate and Rhythm: Regular rhythm.     Heart sounds: Normal heart sounds.  Pulmonary:     Effort: Pulmonary effort is normal. No respiratory distress.     Breath sounds:  Normal breath sounds.  Abdominal:     General: Bowel sounds are normal. There is no distension.     Palpations: Abdomen is soft.     Tenderness: There is abdominal tenderness in the right upper quadrant and epigastric area. There is no right CVA tenderness, left CVA tenderness, guarding or rebound. Negative signs include Murphy's sign and McBurney's sign.     Comments: No guarding, no tenderness along McBurney's point.  Musculoskeletal:        General: No tenderness or deformity.     Cervical back: Normal range of motion.     Right lower leg: No edema.     Left lower leg: No edema.  Skin:  General: Skin is warm and dry.  Neurological:     Mental Status: She is alert and oriented to person, place, and time.     ED Results / Procedures / Treatments   Labs (all labs ordered are listed, but only abnormal results are displayed) Labs Reviewed  COMPREHENSIVE METABOLIC PANEL - Abnormal; Notable for the following components:      Result Value   Glucose, Bld 125 (*)    All other components within normal limits  URINALYSIS, ROUTINE W REFLEX MICROSCOPIC - Abnormal; Notable for the following components:   Specific Gravity, Urine 1.004 (*)    All other components within normal limits  CBC WITH DIFFERENTIAL/PLATELET  LIPASE, BLOOD  PREGNANCY, URINE    EKG None  Radiology CT ABDOMEN PELVIS W CONTRAST  Result Date: 04/30/2023 CLINICAL DATA:  Central and right sided abdominal pain associated with nausea EXAM: CT ABDOMEN AND PELVIS WITH CONTRAST TECHNIQUE: Multidetector CT imaging of the abdomen and pelvis was performed using the standard protocol following bolus administration of intravenous contrast. RADIATION DOSE REDUCTION: This exam was performed according to the departmental dose-optimization program which includes automated exposure control, adjustment of the mA and/or kV according to patient size and/or use of iterative reconstruction technique. CONTRAST:  75mL OMNIPAQUE IOHEXOL 350  MG/ML SOLN COMPARISON:  CT abdomen and pelvis dated 03/22/2012 FINDINGS: Lower chest: No focal consolidation or pulmonary nodule in the lung bases. No pleural effusion or pneumothorax demonstrated. Partially imaged heart size is normal. Hepatobiliary: No focal hepatic lesions. No intra or extrahepatic biliary ductal dilation. Normal gallbladder. Pancreas: No focal lesions or main ductal dilation. Spleen: Normal in size without focal abnormality. Adrenals/Urinary Tract: No adrenal nodules. No suspicious renal mass, calculi or hydronephrosis. No focal bladder wall thickening. Stomach/Bowel: Normal appearance of the stomach. No evidence of bowel wall thickening, distention, or inflammatory changes. Normal appendix. Vascular/Lymphatic: No significant vascular findings are present. No enlarged abdominal or pelvic lymph nodes. Reproductive: No adnexal masses. Other: Trace right pelvic free fluid. No free air or fluid collection. Musculoskeletal: No acute or abnormal lytic or blastic osseous lesions. Soft tissue stranding in the right gluteal region. IMPRESSION: 1. Soft tissue stranding in the right gluteal region. Recommend correlation with physical exam. 2. Trace right pelvic free fluid, likely physiologic. 3. Otherwise, no acute intra-abdominal or intrapelvic findings. Electronically Signed   By: Agustin Cree M.D.   On: 04/30/2023 16:03   US Abdomen Limited RUQ (LIVER/GB)  Result Date: 04/30/2023 CLINICAL DATA:  Right upper quadrant abdominal pain. EXAM: ULTRASOUND ABDOMEN LIMITED RIGHT UPPER QUADRANT COMPARISON:  CT abdomen/pelvis 03/22/2012. FINDINGS: Gallbladder: No gallstones or wall thickening visualized. No sonographic Murphy sign noted by sonographer. Common bile duct: Diameter: 4.9 mm. Liver: No focal lesion identified. Within normal limits in parenchymal echogenicity. Portal vein is patent on color Doppler imaging with normal direction of blood flow towards the liver. Other: None. IMPRESSION: Unremarkable right  upper quadrant ultrasound. Electronically Signed   By: Orvan Falconer M.D.   On: 04/30/2023 12:10    Procedures Procedures    Medications Ordered in ED Medications  sodium chloride 0.9 % bolus 1,000 mL (0 mLs Intravenous Stopped 04/30/23 1256)  ondansetron (ZOFRAN) injection 4 mg (4 mg Intravenous Given 04/30/23 1127)  pantoprazole (PROTONIX) injection 40 mg (40 mg Intravenous Given 04/30/23 1328)  ketorolac (TORADOL) 15 MG/ML injection 15 mg (15 mg Intravenous Given 04/30/23 1327)  morphine (PF) 4 MG/ML injection 4 mg (4 mg Intravenous Given 04/30/23 1438)  iohexol (OMNIPAQUE) 350 MG/ML injection 75  mL (75 mLs Intravenous Contrast Given 04/30/23 1539)  ondansetron (ZOFRAN) injection 4 mg (4 mg Intravenous Given 04/30/23 1625)  sodium chloride 0.9 % bolus 1,000 mL (1,000 mLs Intravenous New Bag/Given 04/30/23 1753)  droperidol (INAPSINE) 2.5 MG/ML injection 1.25 mg (1.25 mg Intravenous Given 04/30/23 1815)    ED Course/ Medical Decision Making/ A&P                             Medical Decision Making Amount and/or Complexity of Data Reviewed Labs: ordered. Radiology: ordered.  Risk Prescription drug management.   This patient presents to the ED for concern of upper abdominal pain, this involves a number of treatment options, and is a complaint that carries with it a high risk of complications and morbidity.  The differential diagnosis includes cholecystitis, reflux versus pancreatitis.    Co morbidities: Discussed in HPI   Brief History:  Healthy 22 year old sent here from urgent care due to abdominal pain that is been ongoing for 2 days.  Endorsing some nausea but no vomiting.  Decrease in oral intake, does endorse pain along the epigastric region worsened with palpation.  EMR reviewed including pt PMHx, past surgical history and past visits to ER.   See HPI for more details   Lab Tests:  I ordered and independently interpreted labs.  The pertinent results include:    I personally  reviewed all laboratory work and imaging. Metabolic panel without any acute abnormality specifically kidney function within normal limits and no significant electrolyte abnormalities. CBC without leukocytosis or significant anemia.   Imaging Studies:  NAD. I personally reviewed all imaging studies and no acute abnormality found. I agree with radiology interpretation.  Medicines ordered:  I ordered medication including bolus, zofran, toradol, protonix  for symptomatic treatment.  Reevaluation of the patient after these medicines showed that the patient improved I have reviewed the patients home medicines and have made adjustments as needed  Reevaluation:  After the interventions noted above I re-evaluated patient and found that they have :improved   Social Determinants of Health:  The patient's social determinants of health were a factor in the care of this patient  Accompanied by her mother at the bedside.    Problem List / ED Course:  Patient here with upper abdominal pain for the past 2 days, does endorse some nausea but no vomiting episodes.  Evaluated urgent care and sent here for further evaluation.  Currently on her menstrual cycle.  Endorsing pain along the epigastric and right upper quadrant region, no prior surgical intervention of her abdomen.  Abdomen is focally tender along the right upper quadrant, with no Murphy sign noted.  No tenderness along McBurney's point.  Blood here has been unremarkable with no leukocytosis, electrolytes are normal.  LFTs are within normal limits, ultrasound of her gallbladder was obtained which did not show any cholelithiasis or cholecystitis.  She is hemodynamically stable afebrile here requesting oral intake.  I discussed the results of the ultrasound with mother at the bedside, who is concern for appendicitis, we did discuss the pain of the patient today seems to be more so upper abdominal, she is without any white count, and a normal workup,  although this does not mean is not appendicitis at this time, I do feel that patient is well-appearing and in without any acute finding in her workup today. Unfortunately, patient was given morphine to help with pain control, this worsened her nausea and she had  an episode of vomiting after this.  She was given Compazine to help with her symptoms.  She remains hemodynamically stable.  I discussed the results of her CT with patient at length.  I also discussed with her mother that she would likely need to follow-up with gastroenterology on outpatient basis.  Her mother does not appear happy after this encounter, however patient is hemodynamically stable, and will be given further medication to help improvement with her nausea. I have discussed this case with my attending Dr. Particia Nearing who has evaluated patient and agrees with plan and management.  Patient reassessed after receiving droperidol, she reports resolution in her symptoms, she has not had any further vomiting and abdominal pain has significantly improved.  I discussed with her mother that we have ruled out any emergent condition at this time, she will need follow-up with gastroenterology if symptoms continue.  She will go home with Zofran along with a PPI to help with symptomatic control.  Patient is hemodynamically stable for discharge.  Dispostion:  After consideration of the diagnostic results and the patients response to treatment, I feel that the patent would benefit from outpatient    Portions of this note were generated with Dragon dictation software. Dictation errors may occur despite best attempts at proofreading.   Final Clinical Impression(s) / ED Diagnoses Final diagnoses:  Pain of upper abdomen    Rx / DC Orders ED Discharge Orders          Ordered    omeprazole (PRILOSEC) 20 MG capsule  Daily        04/30/23 1900    ondansetron (ZOFRAN) 4 MG tablet  Every 6 hours        04/30/23 1900              Claude Manges,  PA-C 04/30/23 1903    Terrilee Files, MD 05/02/23 [phone]

## 2023-04-30 NOTE — ED Triage Notes (Signed)
"  Seen yesterday by PCP, started on amoxicillin and prednisone, around 3pm yesterday she began having sharp pain in center of abdomen, this morning when she woke up she is hurting on the right side of her abdomen too. Complains of nausea, no vomiting or diarrhea" per EMS

## 2023-04-30 NOTE — ED Notes (Signed)
Pt vomiting at this time 

## 2023-04-30 NOTE — Discharge Instructions (Addendum)
Your laboratory results is within normal limits.   We discussed the results of your CT abdomen and pelvis, please follow up with your primary care physician as needed.   You were given medication such as Zofran to help with nausea, please take this as prescribed.  In addition, you were given omeprazole, please to his medication daily for 7 days.

## 2023-04-30 NOTE — ED Notes (Signed)
Family at bedside. 

## 2023-05-01 ENCOUNTER — Encounter: Payer: Self-pay | Admitting: Physician Assistant

## 2023-05-05 ENCOUNTER — Encounter: Payer: Self-pay | Admitting: Gastroenterology

## 2023-05-05 ENCOUNTER — Ambulatory Visit (INDEPENDENT_AMBULATORY_CARE_PROVIDER_SITE_OTHER): Payer: Medicaid Other | Admitting: Gastroenterology

## 2023-05-05 VITALS — BP 118/78 | HR 97 | Ht 62.0 in | Wt 107.0 lb

## 2023-05-05 DIAGNOSIS — R1011 Right upper quadrant pain: Secondary | ICD-10-CM | POA: Diagnosis not present

## 2023-05-05 DIAGNOSIS — R112 Nausea with vomiting, unspecified: Secondary | ICD-10-CM

## 2023-05-05 DIAGNOSIS — R1033 Periumbilical pain: Secondary | ICD-10-CM

## 2023-05-05 MED ORDER — HYOSCYAMINE SULFATE 0.125 MG SL SUBL: 0.1250 mg | SUBLINGUAL_TABLET | Freq: Three times a day (TID) | SUBLINGUAL | 0 refills | Status: DC | PRN

## 2023-05-05 NOTE — Patient Instructions (Addendum)
We have sent the following medications to your pharmacy for you to pick up at your convenience: Levsin 0.125 mg SL every 8 hours as needed.   You have been scheduled for a HIDA scan at [facility] (1st floor) on Wednesday 05/20/23. Please arrive 30 minutes prior to your scheduled appointment at  8 am. Make certain not to have anything to eat or drink at least 6 hours prior to your test. Should this appointment date or time not work well for you, please call radiology scheduling at [phone].  _____________________________________________________________________ hepatobiliary (HIDA) scan is an imaging procedure used to diagnose problems in the liver, gallbladder and bile ducts. In the HIDA scan, a radioactive chemical or tracer is injected into a vein in your arm. The tracer is handled by the liver like bile. Bile is a fluid produced and excreted by your liver that helps your digestive system break down fats in the foods you eat. Bile is stored in your gallbladder and the gallbladder releases the bile when you eat a meal. A special nuclear medicine scanner (gamma camera) tracks the flow of the tracer from your liver into your gallbladder and small intestine.  During your HIDA scan  You'll be asked to change into a hospital gown before your HIDA scan begins. Your health care team will position you on a table, usually on your back. The radioactive tracer is then injected into a vein in your arm.The tracer travels through your bloodstream to your liver, where it's taken up by the bile-producing cells. The radioactive tracer travels with the bile from your liver into your gallbladder and through your bile ducts to your small intestine.You may feel some pressure while the radioactive tracer is injected into your vein. As you lie on the table, a special gamma camera is positioned over your abdomen taking pictures of the tracer as it moves through your body. The gamma camera takes pictures continually  for about an hour. You'll need to keep still during the HIDA scan. This can become uncomfortable, but you may find that you can lessen the discomfort by taking deep breaths and thinking about other things. Tell your health care team if you're uncomfortable. The radiologist will watch on a computer the progress of the radioactive tracer through your body. The HIDA scan may be stopped when the radioactive tracer is seen in the gallbladder and enters your small intestine. This typically takes about an hour. In some cases extra imaging will be performed if original images aren't satisfactory, if morphine is given to help visualize the gallbladder or if the medication CCK is given to look at the contraction of the gallbladder. This test typically takes 2 hours to complete. ________________________________________________________________________

## 2023-05-05 NOTE — Progress Notes (Addendum)
05/05/2023 Autumn Cortez [id number] 2001-06-08   HISTORY OF PRESENT ILLNESS:  This is a 22 year old female who is new to our office.  It was recommended by the ED that she be seen by GI.  She reports that she's been having issues with abdominal pain and nausea with occasional episodes of vomiting for the past couple of weeks. Complaining of mid to upper abdominal pain, not necessarily associated with eating.  CT scan and ultrasound as well as labs in the ER unremarkable.  She denies heartburn and reflux symptoms and does not use NSAIDs.  She is on omeprazole 20 mg daily which was prescribed by the ED.  She reports no previous history of GI issues except for some mild lactose intolerance.  Her mother was on speaker phone.  Patient does not tend to have chronic complaints of abdominal pain according to her report as well.  She says that sometimes she will be just fine and the next minute she is doubled over in pain.  She tells me that she is moving her bowels just fine.  No constipation or diarrhea.  No black or bloody stools.   Past Medical History:  Diagnosis Date   ADD (attention deficit disorder with hyperactivity)    Anxiety    Asthma    Bipolar 1 disorder (HCC)    Migraines    Past Surgical History:  Procedure Laterality Date   ADENOIDECTOMY  8/11   TONSILLECTOMY      reports that she has been smoking e-cigarettes. She has never used smokeless tobacco. She reports that she does not currently use drugs. She reports that she does not drink alcohol. family history includes Asthma in her mother; Bipolar disorder in her father; Breast cancer in her paternal grandmother and another family member; Cancer in her maternal grandmother; Diabetes in her paternal grandfather; Heart disease in her mother and paternal grandfather; Hypertension in her mother and paternal grandfather; Migraines in her mother; Post-traumatic stress disorder in her father. Allergies  Allergen Reactions    Pollen Extract Other (See Comments)    Seasonal allergies - runny nose, sneezing, watery eyes Congestion and sneezing      Outpatient Encounter Medications as of 05/05/2023  Medication Sig   albuterol (VENTOLIN HFA) 108 (90 Base) MCG/ACT inhaler Inhale 2 puffs into the lungs every 6 (six) hours as needed for shortness of breath or wheezing.   amoxicillin-clavulanate (AUGMENTIN) 875-125 MG tablet Take 1 tablet by mouth 2 (two) times daily for 7 days.   amphetamine-dextroamphetamine (ADDERALL XR) 20 MG 24 hr capsule Take 1 capsule (20 mg total) by mouth daily.   amphetamine-dextroamphetamine (ADDERALL XR) 20 MG 24 hr capsule Take 1 capsule (20 mg total) by mouth daily.   Atogepant (QULIPTA) 60 MG TABS Take 60 mg by mouth daily at 2 PM.   budesonide (PULMICORT) 180 MCG/ACT inhaler Inhale 1 puff into the lungs 2 (two) times daily.   buPROPion (ZYBAN) 150 MG 12 hr tablet 1 week before quit date, take 1 tab daily for 3 days and then 1 tab twice daily.   busPIRone (BUSPAR) 7.5 MG tablet TAKE 1 TABLET BY MOUTH TWICE A DAY   Erenumab-aooe (AIMOVIG) 140 MG/ML SOAJ Inject 140 mg into the skin every 28 (twenty-eight) days.   fluconazole (DIFLUCAN) 150 MG tablet Take 1 tab, repeat in 72 hours if no improvement.   fluticasone (FLONASE) 50 MCG/ACT nasal spray Place 2 sprays into both nostrils daily.   gabapentin (NEURONTIN) 300 MG  capsule Take 1-2 capsule 3 times daily as needed for panic attacks.   lamoTRIgine (LAMICTAL) 100 MG tablet Take 1 tablet (100 mg total) by mouth 2 (two) times daily.   montelukast (SINGULAIR) 10 MG tablet TAKE ONE TABLET BY MOUTH EVERY NIGHT AT BEDTIME   NIKKI 3-0.02 MG tablet TAKE 1 TABLET BY MOUTH DAILY   omeprazole (PRILOSEC) 20 MG capsule Take 1 capsule (20 mg total) by mouth daily for 7 days.   ondansetron (ZOFRAN ODT) 8 MG disintegrating tablet Take 1 tablet (8 mg total) by mouth every 8 (eight) hours as needed for nausea or vomiting.   ondansetron (ZOFRAN) 4 MG tablet Take 1  tablet (4 mg total) by mouth every 6 (six) hours for 7 days.   pantoprazole (PROTONIX) 40 MG tablet Take 1 tablet (40 mg total) by mouth daily.   QUEtiapine (SEROQUEL) 200 MG tablet Take 1 tablet (200 mg total) by mouth at bedtime as needed (sleep).   rizatriptan (MAXALT-MLT) 10 MG disintegrating tablet Take 1 tablet (10 mg total) by mouth as needed for migraine (May repeat in 2 hours.  Maximum 2 tablets in 24 hours). May repeat in 2 hours if needed   No facility-administered encounter medications on file as of 05/05/2023.     REVIEW OF SYSTEMS  : All other systems reviewed and negative except where noted in the History of Present Illness.   PHYSICAL EXAM: BP 118/78   Pulse 97   Ht 5\' 2"  (1.575 m)   Wt 107 lb (48.5 kg)   BMI 19.57 kg/m  General: Well developed white female in no acute distress Head: Normocephalic and atraumatic Eyes:  Sclerae anicteric, conjunctiva pink. Ears: Normal auditory acuity Lungs: Clear throughout to auscultation; no W/R/R. Heart: Regular rate and rhythm no M/R/G. Abdomen: Soft, non-distended.  BS present.  Minimal upper and mid-abdominal TTP. Musculoskeletal: Symmetrical with no gross deformities  Skin: No lesions on visible extremities Extremities: No edema  Neurological: Alert oriented x 4, grossly non-focal Psychological:  Alert and cooperative. Normal mood and affect  ASSESSMENT AND PLAN: *Abdominal pain and nausea with occasional episodes of vomiting: Complaining of mid to upper abdominal pain with associated nausea and episodes of vomiting just for the past couple weeks.  CT scan and ultrasound as well as labs in the ER unremarkable.  She denies heartburn and reflux symptoms and does not use NSAIDs.  She is on omeprazole 20 mg daily which was prescribed by the ED.  She is at low risk for an ulcer.  Not sure what else is going on.  Could be biliary dysfunction although it does not sound classic for that.  Will go ahead and proceed with a HIDA scan with  [facility].  If that is negative could consider endoscopy.  She reports no previous history of GI issues except for some mild lactose intolerance.  Will give some hyoscyamine to use intermittently for now as well.  Prescription sent to pharmacy.   CC:  Sharlene Dory*

## 2023-05-07 NOTE — Progress Notes (Signed)
____________________________________________________________  Attending physician addendum:  Thank you for sending this case to me. I have reviewed the entire note and agree with the plan.   Seena Face Danis, MD  ____________________________________________________________  

## 2023-05-20 ENCOUNTER — Ambulatory Visit (HOSPITAL_COMMUNITY)
Admission: RE | Admit: 2023-05-20 | Discharge: 2023-05-20 | Disposition: A | Discharge status: Home / Self Care | Payer: Medicaid Other | Source: Ambulatory Visit | Attending: Gastroenterology | Admitting: Gastroenterology

## 2023-05-20 DIAGNOSIS — R1033 Periumbilical pain: Secondary | ICD-10-CM | POA: Insufficient documentation

## 2023-05-20 DIAGNOSIS — R1011 Right upper quadrant pain: Secondary | ICD-10-CM | POA: Diagnosis present

## 2023-05-20 MED ORDER — TECHNETIUM TC 99M MEBROFENIN IV KIT
5.2000 | PACK | Freq: Once | INTRAVENOUS | Status: AC | PRN
  Administered 2023-05-20: 5.2 via INTRAVENOUS

## 2023-05-22 IMAGING — MR MR HEAD WO/W CM
6 of 11 series · 27 of 48 positions shown · IV contrast (gadavist)
Comparison: No pertinent prior exams available for comparison.

CLINICAL DATA: Transient alteration of awareness. Mental status
change, unknown cause.

EXAM:
MRI HEAD WITHOUT AND WITH CONTRAST
TECHNIQUE: Multiplanar, multiecho pulse sequences of the brain and surrounding
structures were obtained without and with intravenous contrast.
CONTRAST:  5mL GADAVIST GADOBUTROL 1 MMOL/ML IV SOLN

[Series 2: DWI · axial · 3.0mm · 0.94mm/px · z∈[-77,+67]mm · 9 of 100 slices shown (1 of 2)]
[im 1/100]
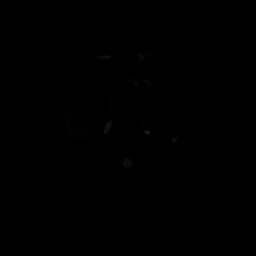
[im 13/100]
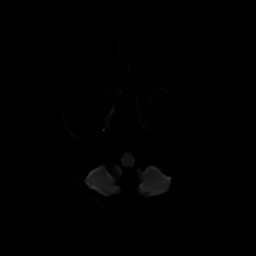
[im 25/100]
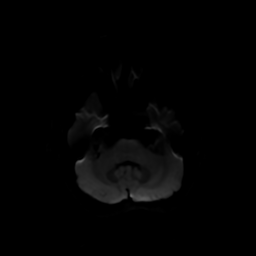
[im 38/100]
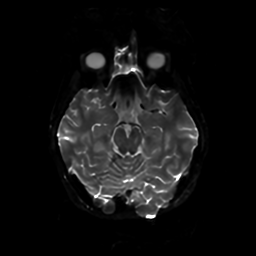
[im 50/100]
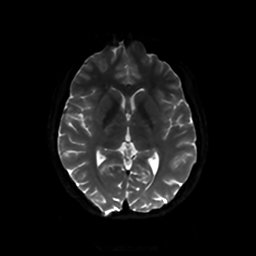
[im 62/100]
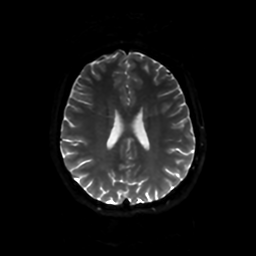
[im 75/100]
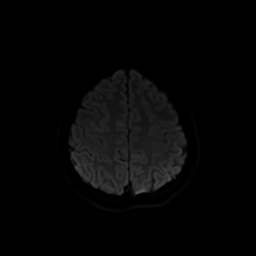
[im 87/100]
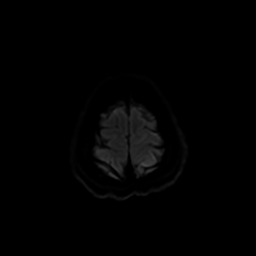
[im 100/100]
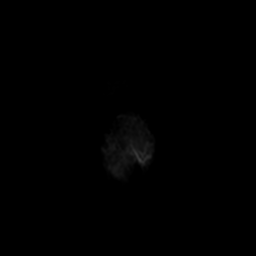

[Series 3: DWI · coronal · 4.0mm · 0.94mm/px · 7 of 70 slices shown (2 of 2)]
[im 1/70]
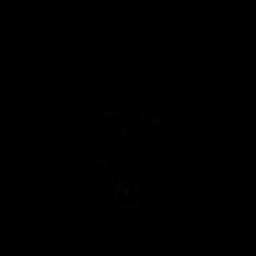
[im 12/70]
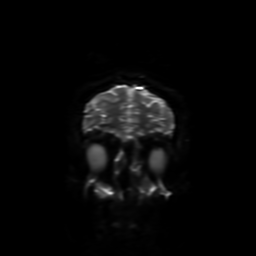
[im 24/70]
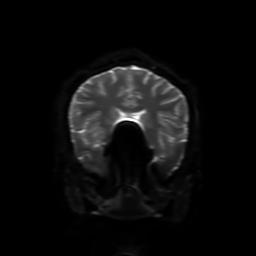
[im 35/70]
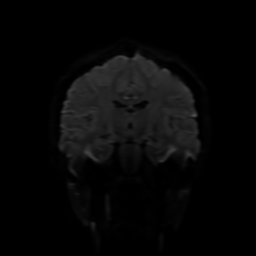
[im 47/70]
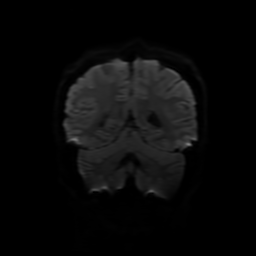
[im 58/70]
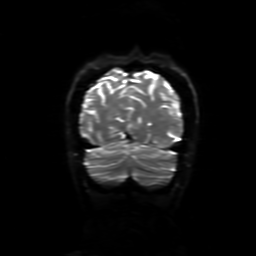
[im 70/70]
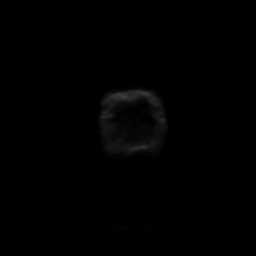

[Series 4: FLAIR · sagittal · 5.0mm · 0.23mm/px · 2 of 25 slices shown (1 of 2)]
[im 1/25]
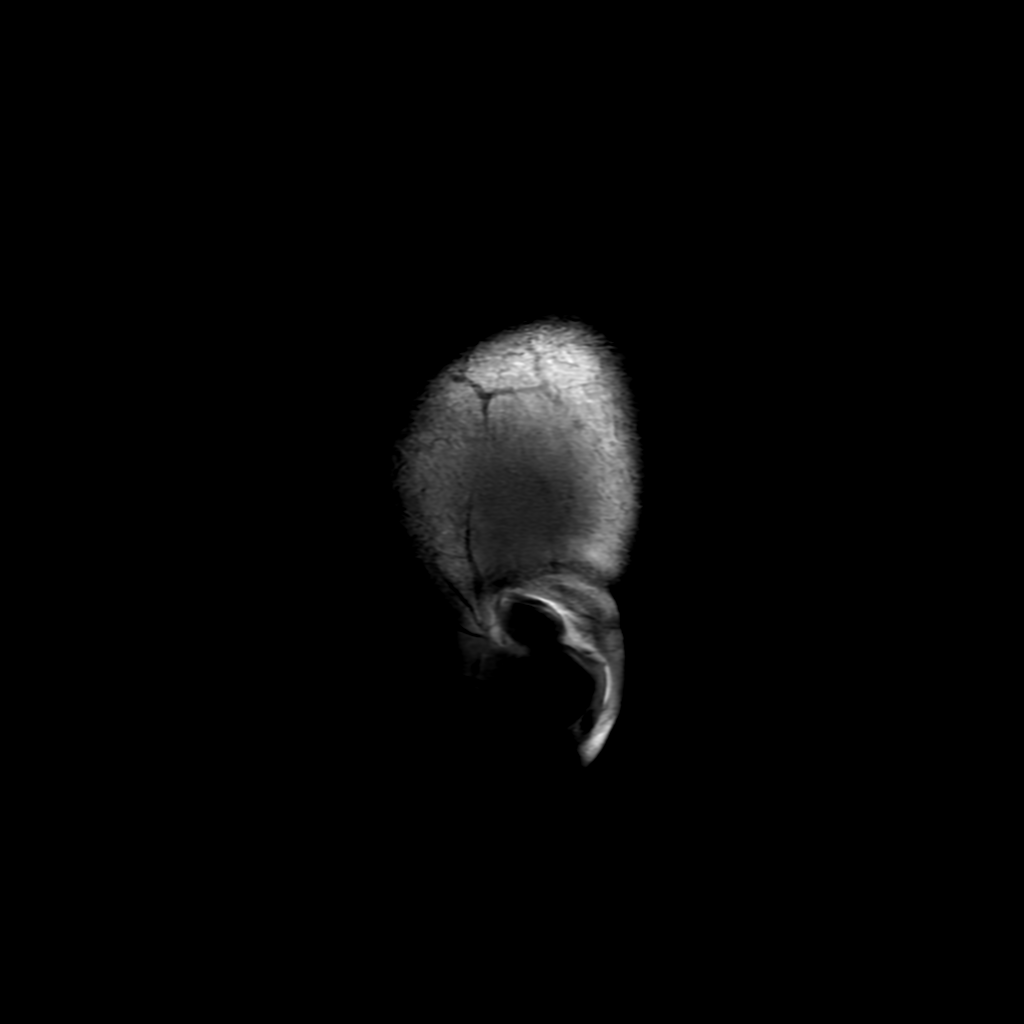
[im 25/25]
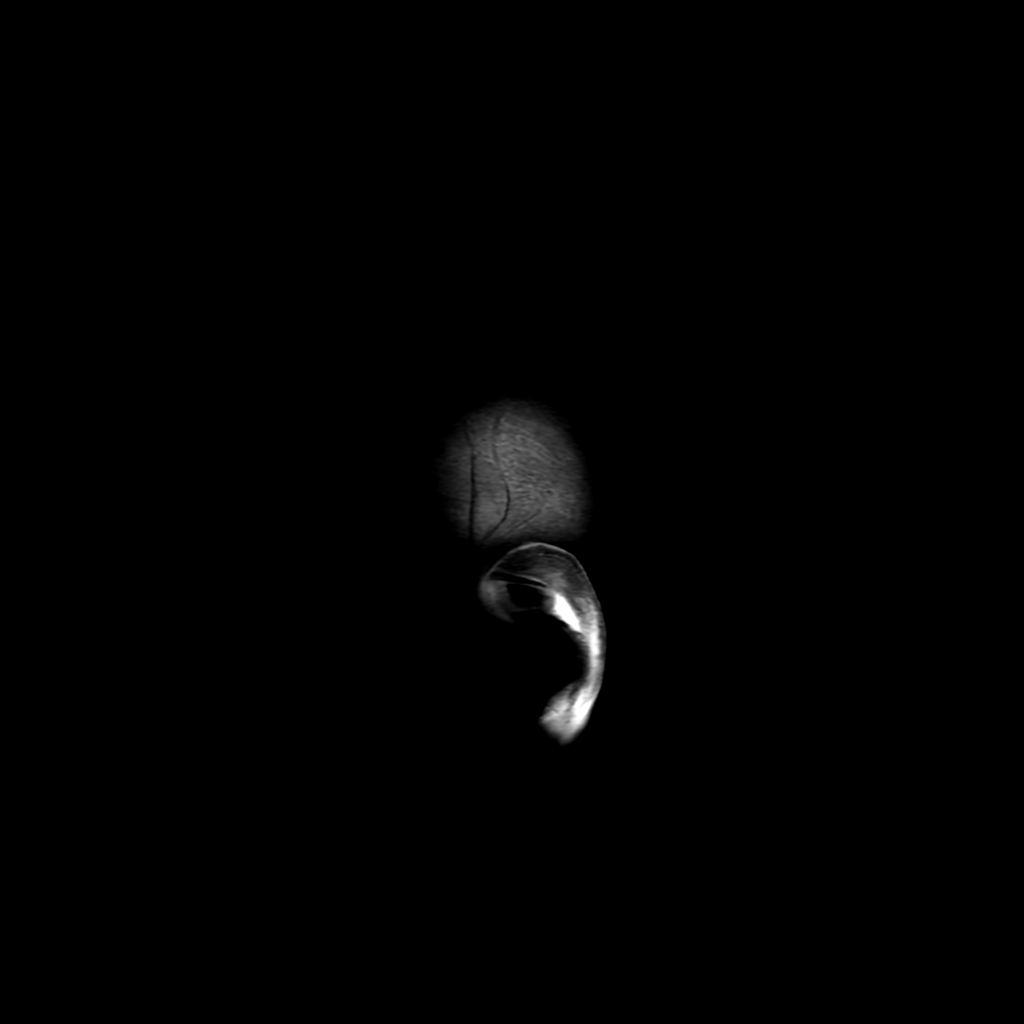

[Series 6: FLAIR · axial · 5.0mm · 0.47mm/px · z∈[-80,+67]mm · 2 of 26 slices shown (2 of 2)]
[im 1/26]
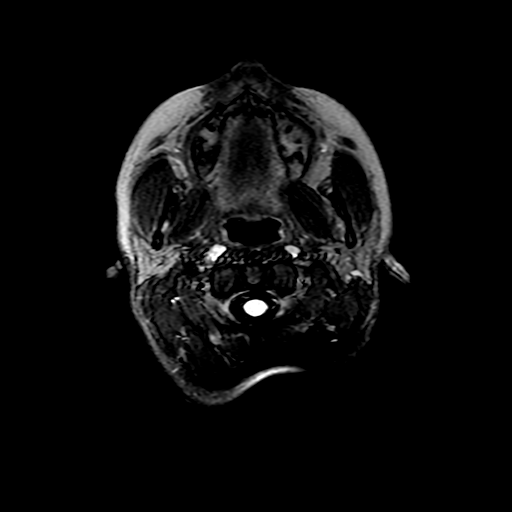
[im 26/26]
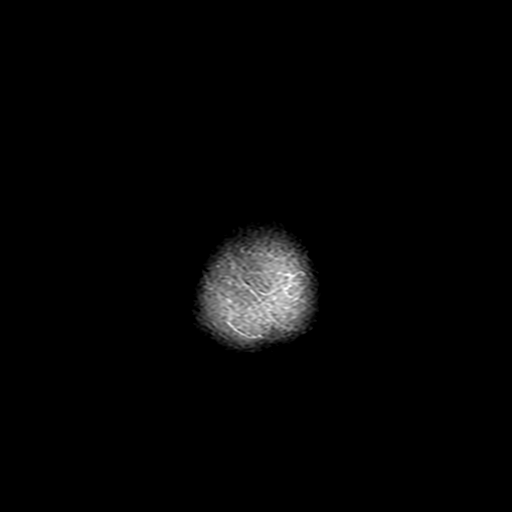

[Series 250: ADC · axial · 3.0mm · 0.94mm/px · z∈[-77,+67]mm · 4 of 50 slices shown (1 of 2)]
[im 1/50]
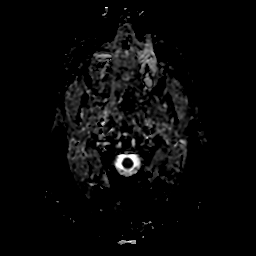
[im 17/50]
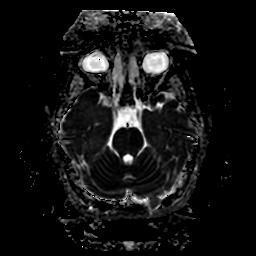
[im 33/50]
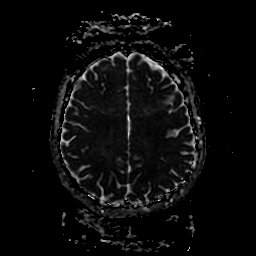
[im 50/50]
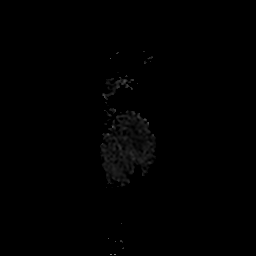

[Series 350: ADC · coronal · 4.0mm · 0.94mm/px · 3 of 35 slices shown (2 of 2)]
[im 1/35]
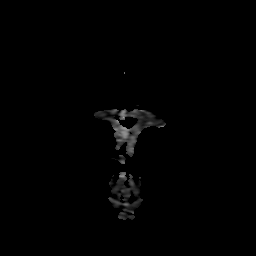
[im 18/35]
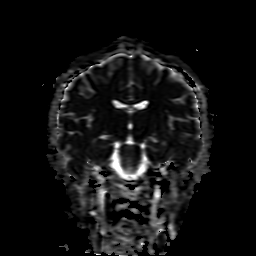
[im 35/35]
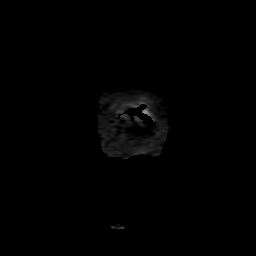

[27 of 48 positions shown; findings below may reference images not displayed]

FINDINGS: Brain:

Cerebral volume is normal.

No cortical encephalomalacia is identified. No significant cerebral
white matter disease.

There is no acute infarct.

No evidence of an intracranial mass.

No chronic intracranial blood products.

No extra-axial fluid collection.

No midline shift.

No pathologic intracranial enhancement identified.

Vascular: Maintained flow voids within the proximal large arterial
vessels.

Skull and upper cervical spine: No focal suspicious marrow lesion.

Sinuses/Orbits: Visualized orbits show no acute finding. Moderate
mucosal thickening within the right frontal sinus. Mild mucosal
thickening within the left frontal sinus. Moderate/severe mucosal
thickening within the bilateral ethmoid sinuses. Mild mucosal
thickening within the bilateral sphenoid sinuses. Moderate mucosal
thickening within the bilateral maxillary sinuses.
IMPRESSION: Unremarkable MRI appearance of the brain. No evidence of acute
intracranial abnormality.

Paranasal sinus disease, as described.

## 2023-05-26 ENCOUNTER — Encounter: Payer: Self-pay | Admitting: Family Medicine

## 2023-05-26 ENCOUNTER — Ambulatory Visit (INDEPENDENT_AMBULATORY_CARE_PROVIDER_SITE_OTHER): Payer: Medicaid Other | Admitting: Family Medicine

## 2023-05-26 VITALS — BP 120/80 | HR 74 | Temp 98.2°F | Ht 62.0 in | Wt 109.1 lb

## 2023-05-26 DIAGNOSIS — F319 Bipolar disorder, unspecified: Secondary | ICD-10-CM | POA: Diagnosis not present

## 2023-05-26 NOTE — Patient Instructions (Addendum)
Take the Wellbutrin/bupropion daily for 5 days, then every other day for 5 doses and then stop.   Let us know if you need anything.  [facility] [address] [place], [place] [address] [phone]  [facility] [address] [place], [place] [address] [phone]  [facility] [address] [place], [place] [address] [phone]  [facility] [address], [place], [place], [phone] [address], [place], [place], [phone]  [facility] [address] [place], [place] [address] [phone]  [facility] [address] [place], [place] [phone]  [facility] [address] [place], [place] [phone]  [facility] [address] [place], [place]   [phone].   Please go online to complete a form to submit first.  [facility]  [address] [place], [place] [phone].     [facility] -  local practices located at: [address] [place], [place]. [phone]. [address] [place], [place], [place].  [phone]. [address], [address], [place], [place].  [phone].  Contact one of these offices sooner than later as it can take 2-3 months to get a new patient appointment.

## 2023-05-26 NOTE — Progress Notes (Signed)
Chief Complaint  Patient presents with   Follow-up    Medication is not working     Subjective: Patient is a 22 y.o. female here for f/u.  Started on bupropion 150 mg twice daily for cessation of vaping.  It did help with that and her last vape was a week ago.  It did make her emotionally labile when combined with her other medications for bipolar.  She does not wish to keep taking it.  Patient is not currently seeing a psychiatrist.  Past Medical History:  Diagnosis Date   ADD (attention deficit disorder with hyperactivity)    Anxiety    Asthma    Bipolar 1 disorder (HCC)    Migraines     Objective: BP 120/80 (BP Location: Left Arm, Patient Position: Sitting, Cuff Size: Normal)   Pulse 74   Temp 98.2 F (36.8 C) (Oral)   Ht 5\' 2"  (1.575 m)   Wt 109 lb 2 oz (49.5 kg)   LMP 04/29/2023 (Approximate)   SpO2 99%   BMI 19.96 kg/m  General: Awake, appears stated age Lungs: No accessory muscle use Psych: Age appropriate judgment and insight, normal affect and mood  Assessment and Plan: Bipolar 1 disorder (HCC)  Adverse effects of medication.  Take bupropion 150 mg daily for 5 days and then every other day for 5 doses and then stop.  She will let me know if there are issues.  I expect her emotional lability to improve as she weans off of this.  If she does not, she will let me know.  Psychiatry resources provided in her paperwork. The patient voiced understanding and agreement to the plan.  Jilda Roche [place], DO 05/26/23  1:07 PM

## 2023-05-29 ENCOUNTER — Telehealth: Payer: Self-pay | Admitting: Pharmacy Technician

## 2023-05-29 ENCOUNTER — Ambulatory Visit (AMBULATORY_SURGERY_CENTER): Payer: Medicaid Other

## 2023-05-29 ENCOUNTER — Other Ambulatory Visit (HOSPITAL_COMMUNITY): Payer: Self-pay

## 2023-05-29 VITALS — Ht 62.0 in | Wt 105.0 lb

## 2023-05-29 DIAGNOSIS — R112 Nausea with vomiting, unspecified: Secondary | ICD-10-CM

## 2023-05-29 DIAGNOSIS — R1011 Right upper quadrant pain: Secondary | ICD-10-CM

## 2023-05-29 DIAGNOSIS — R1033 Periumbilical pain: Secondary | ICD-10-CM

## 2023-05-29 NOTE — Telephone Encounter (Signed)
Patient Advocate Encounter  Received notification from [employer] that prior authorization for QULIPTA 60MG  is required.   PA submitted on 5.31.24 Key [id number] Status is pending

## 2023-05-29 NOTE — Progress Notes (Signed)
Pre visit completed via phone call; Patient verified name, DOB, and address;  No egg or soy allergy known to patient;  No issues known to pt with past sedation with any surgeries or procedures; Patient denies ever being told they had issues or difficulty with intubation;  No FH of Malignant Hyperthermia; Pt is not on diet pills; Pt is not on home 02;  Pt is not on blood thinners;  Pt denies issues with constipation;  No A fib or A flutter; Have any cardiac testing pending--NO Pt instructed to use [employer] or GoodRx for a price reduction on prep;   Insurance verified during PV appt=[place] [employer]  Patient's chart reviewed by Cathlyn Parsons CNRA prior to previsit and patient appropriate for the [facility].  Previsit completed and red dot placed by patient's name on their procedure day (on provider's schedule).    Instructions sent to MyChart as patient reports she can print them out for herself;

## 2023-06-08 ENCOUNTER — Encounter: Payer: Self-pay | Admitting: Gastroenterology

## 2023-06-08 ENCOUNTER — Ambulatory Visit (AMBULATORY_SURGERY_CENTER): Payer: BLUE CROSS/BLUE SHIELD | Admitting: Gastroenterology

## 2023-06-08 VITALS — BP 113/68 | HR 82 | Temp 97.7°F | Resp 10 | Ht 60.0 in | Wt 105.0 lb

## 2023-06-08 DIAGNOSIS — R1013 Epigastric pain: Secondary | ICD-10-CM | POA: Diagnosis not present

## 2023-06-08 DIAGNOSIS — K319 Disease of stomach and duodenum, unspecified: Secondary | ICD-10-CM

## 2023-06-08 DIAGNOSIS — R1011 Right upper quadrant pain: Secondary | ICD-10-CM | POA: Diagnosis not present

## 2023-06-08 DIAGNOSIS — K3189 Other diseases of stomach and duodenum: Secondary | ICD-10-CM

## 2023-06-08 MED ORDER — SODIUM CHLORIDE 0.9 % IV SOLN
500.0000 mL | INTRAVENOUS | Status: DC
Start: 2023-06-08 — End: 2023-06-08

## 2023-06-08 NOTE — Progress Notes (Signed)
History and Physical:  This patient presents for endoscopic testing for: Encounter Diagnoses  Name Primary?   RUQ abdominal pain Yes   Epigastric pain     Clinical details in 06/05/23 office consult note.  Chronic upper abdominal pain, negative imaging including (most recently) [facility]-HIDA scan.  Patient is otherwise without complaints or active issues today.   Past Medical History: Past Medical History:  Diagnosis Date   ADD (attention deficit disorder with hyperactivity)    Anxiety    Asthma    Bipolar 1 disorder (HCC)    Migraines      Past Surgical History: Past Surgical History:  Procedure Laterality Date   ADENOIDECTOMY  07/29/2010   TONSILLECTOMY  2011   UPPER GASTROINTESTINAL ENDOSCOPY  2013   WISDOM TOOTH EXTRACTION      Allergies: Allergies  Allergen Reactions   Bee Pollen     Other Reaction(s): Other (See Comments)  Seasonal allergies - runny nose, sneezing, watery eyes, Congestion and sneezing   Bupropion Other (See Comments)    Mood lability   Lactose Diarrhea   Pollen Extract Other (See Comments)    Seasonal allergies - runny nose, sneezing, watery eyes Congestion and sneezing    Outpatient Meds: Current Outpatient Medications  Medication Sig Dispense Refill   Atogepant (QULIPTA) 60 MG TABS Take 60 mg by mouth daily at 2 PM. 90 tablet 1   budesonide (PULMICORT) 180 MCG/ACT inhaler Inhale 1 puff into the lungs 2 (two) times daily. 3 each 2   busPIRone (BUSPAR) 7.5 MG tablet TAKE 1 TABLET BY MOUTH TWICE A DAY 60 tablet 2   lamoTRIgine (LAMICTAL) 100 MG tablet Take 1 tablet (100 mg total) by mouth 2 (two) times daily. 180 tablet 2   montelukast (SINGULAIR) 10 MG tablet TAKE ONE TABLET BY MOUTH EVERY NIGHT AT BEDTIME 30 tablet 3   NIKKI 3-0.02 MG tablet TAKE 1 TABLET BY MOUTH DAILY 84 tablet 3   ondansetron (ZOFRAN-ODT) 4 MG disintegrating tablet Take 4 mg by mouth every 8 (eight) hours as needed for nausea or vomiting.     QUEtiapine (SEROQUEL) 200 MG  tablet Take 1 tablet (200 mg total) by mouth at bedtime as needed (sleep). (Patient taking differently: Take 0.5 tablets by mouth at bedtime as needed (sleep).) 90 tablet 2   albuterol (VENTOLIN HFA) 108 (90 Base) MCG/ACT inhaler Inhale 2 puffs into the lungs every 6 (six) hours as needed for shortness of breath or wheezing. 18 g 5   amphetamine-dextroamphetamine (ADDERALL XR) 20 MG 24 hr capsule Take 1 capsule (20 mg total) by mouth daily. (Patient taking differently: Take 20 mg by mouth daily as needed.) 30 capsule 0   fluticasone (FLONASE) 50 MCG/ACT nasal spray Place 2 sprays into both nostrils daily. 16 g 1   gabapentin (NEURONTIN) 300 MG capsule Take 1-2 capsule 3 times daily as needed for panic attacks. 30 capsule 3   hyoscyamine (LEVSIN SL) 0.125 MG SL tablet Place 1 tablet (0.125 mg total) under the tongue every 8 (eight) hours as needed. 30 tablet 0   omeprazole (PRILOSEC) 20 MG capsule Take 1 capsule (20 mg total) by mouth daily for 7 days. 7 capsule 0   pantoprazole (PROTONIX) 40 MG tablet Take 1 tablet (40 mg total) by mouth daily. 30 tablet 1   rizatriptan (MAXALT-MLT) 10 MG disintegrating tablet Take 1 tablet (10 mg total) by mouth as needed for migraine (May repeat in 2 hours.  Maximum 2 tablets in 24 hours). May repeat in 2  hours if needed 10 tablet 5   Current Facility-Administered Medications  Medication Dose Route Frequency Provider Last Rate Last Admin   0.9 %  sodium chloride infusion  500 mL Intravenous Continuous Danis, Starr [place] III, MD          ___________________________________________________________________ Objective   Exam:  BP 112/75   Pulse 90   Temp 97.7 F (36.5 C) (Skin)   Ht 5' (1.524 m)   Wt 105 lb (47.6 kg)   LMP 04/29/2023 (Approximate)   SpO2 98%   BMI 20.51 kg/m   CV: regular , S1/S2 Resp: clear to auscultation bilaterally, normal RR and effort noted GI: soft, no tenderness, with active bowel sounds.   Assessment: Encounter Diagnoses   Name Primary?   RUQ abdominal pain Yes   Epigastric pain      Plan:  EGD  The benefits and risks of the planned procedure were described in detail with the patient or (when appropriate) their health care proxy.  Risks were outlined as including, but not limited to, bleeding, infection, perforation, adverse medication reaction leading to cardiac or pulmonary decompensation, pancreatitis (if ERCP).  The limitation of incomplete mucosal visualization was also discussed.  No guarantees or warranties were given.  The patient is appropriate for an endoscopic procedure in the ambulatory setting.   - Amada Jupiter, MD

## 2023-06-08 NOTE — Progress Notes (Signed)
Called to room to assist during endoscopic procedure.  Patient ID and intended procedure confirmed with present staff. Received instructions for my participation in the procedure from the performing physician.  

## 2023-06-08 NOTE — Patient Instructions (Addendum)
Continue present medications. Await pathology results. Return to primary care physician.   YOU HAD AN ENDOSCOPIC PROCEDURE TODAY AT THE [facility]:   Refer to the procedure report that was given to you for any specific questions about what was found during the examination.  If the procedure report does not answer your questions, please call your gastroenterologist to clarify.  If you requested that your care partner not be given the details of your procedure findings, then the procedure report has been included in a sealed envelope for you to review at your convenience later.  YOU SHOULD EXPECT: Some feelings of bloating in the abdomen. Passage of more gas than usual.  Walking can help get rid of the air that was put into your GI tract during the procedure and reduce the bloating.  Please Note:  You might notice some irritation and congestion in your nose or some drainage.  This is from the oxygen used during your procedure.  There is no need for concern and it should clear up in a day or so.  SYMPTOMS TO REPORT IMMEDIATELY:  Following upper endoscopy (EGD)  Vomiting of blood or coffee ground material  New chest pain or pain under the shoulder blades  Painful or persistently difficult swallowing  New shortness of breath  Fever of 100F or higher  Black, tarry-looking stools  For urgent or emergent issues, a gastroenterologist can be reached at any hour by calling [phone]. Do not use MyChart messaging for urgent concerns.    DIET:  We do recommend a small meal at first, but then you may proceed to your regular diet.  Drink plenty of fluids but you should avoid alcoholic beverages for 24 hours.  ACTIVITY:  You should plan to take it easy for the rest of today and you should NOT DRIVE or use heavy machinery until tomorrow (because of the sedation medicines used during the test).    FOLLOW UP: Our staff will call the number listed on your records the next business day  following your procedure.  We will call around 7:15- 8:00 am to check on you and address any questions or concerns that you may have regarding the information given to you following your procedure. If we do not reach you, we will leave a message.     If any biopsies were taken you will be contacted by phone or by letter within the next 1-3 weeks.  Please call us at [phone] if you have not heard about the biopsies in 3 weeks.    SIGNATURES/CONFIDENTIALITY: You and/or your care partner have signed paperwork which will be entered into your electronic medical record.  These signatures attest to the fact that that the information above on your After Visit Summary has been reviewed and is understood.  Full responsibility of the confidentiality of this discharge information lies with you and/or your care-partner.

## 2023-06-08 NOTE — Progress Notes (Signed)
To pacu, Vss.Report to [email or web address] 

## 2023-06-08 NOTE — Progress Notes (Signed)
Patient states there have been no changes to medical or surgical history since time of pre-visit. 

## 2023-06-08 NOTE — Op Note (Signed)
[facility] Patient Name: Autumn Cortez Procedure Date: 06/08/2023 1:32 PM MRN: [id number] Endoscopist: Sherilyn Cooter L. Myrtie Neither , MD, [id number] Age: 22 Referring MD:  Date of Birth: 10/03/2001 Gender: Female Account #: [id number] Procedure:                Upper GI endoscopy Indications:              Epigastric abdominal pain, Abdominal pain in the                            right upper quadrant, Periumbilical abdominal pain                           about 2 months, reports normal bowel pattern                           normal RUQ [place], CTAP, CCK-HIDA, CBC, CMP, negative                            pregnancy test Medicines:                Monitored Anesthesia Care Procedure:                Pre-Anesthesia Assessment:                           - Prior to the procedure, a History and Physical                            was performed, and patient medications and                            allergies were reviewed. The patient's tolerance of                            previous anesthesia was also reviewed. The risks                            and benefits of the procedure and the sedation                            options and risks were discussed with the patient.                            All questions were answered, and informed consent                            was obtained. Prior Anticoagulants: The patient has                            taken no anticoagulant or antiplatelet agents. ASA                            Grade Assessment: II - A patient with mild systemic  disease. After reviewing the risks and benefits,                            the patient was deemed in satisfactory condition to                            undergo the procedure.                           After obtaining informed consent, the endoscope was                            passed under direct vision. Throughout the                            procedure, the patient's blood pressure,  pulse, and                            oxygen saturations were monitored continuously. The                            Olympus Scope [id number] was introduced through the                            mouth, and advanced to the second part of duodenum.                            The upper GI endoscopy was accomplished without                            difficulty. The patient tolerated the procedure                            well. Scope In: Scope Out: Findings:                 The larynx was normal.                           The esophagus was normal.                           The entire examined stomach was normal. Several                            biopsies were obtained in the gastric body and in                            the gastric antrum with cold forceps for histology.                           The cardia and gastric fundus were normal on                            retroflexion.  The examined duodenum was normal. Biopsies for                            histology were taken with a cold forceps for                            evaluation of celiac disease. Complications:            No immediate complications. Estimated Blood Loss:     Estimated blood loss was minimal. Impression:               - Normal larynx.                           - Normal esophagus.                           - Normal stomach.                           - Normal examined duodenum. Biopsied.                           - Several biopsies were obtained in the gastric                            body and in the gastric antrum.                           If all biopsies normal, overall clinical picture                            favors functional abdominal pain - consider                            possibility of a stress reaction. Recommendation:           - Patient has a contact number available for                            emergencies. The signs and symptoms of potential                             delayed complications were discussed with the                            patient. Return to normal activities tomorrow.                            Written discharge instructions were provided to the                            patient.                           - Resume previous diet.                           -  Continue present medications.                           - Await pathology results.                           - Return to primary care physician. Kennede Lusk L. Myrtie Neither, MD 06/08/2023 1:51:41 PM This report has been signed electronically.

## 2023-06-09 ENCOUNTER — Telehealth: Payer: Self-pay | Admitting: *Deleted

## 2023-06-09 NOTE — Telephone Encounter (Signed)
  Follow up Call-     06/08/2023   12:50 PM  Call back number  Post procedure Call Back phone  # [phone]  Permission to leave phone message Yes     Patient questions:  Do you have a fever, pain , or abdominal swelling? No. Pain Score  0 *  Have you tolerated food without any problems? Yes.    Have you been able to return to your normal activities? Yes.    Do you have any questions about your discharge instructions: Diet   No. Medications  No. Follow up visit  No.  Do you have questions or concerns about your Care? No.  Actions: * If pain score is 4 or above: No action needed, pain <4.

## 2023-06-12 ENCOUNTER — Encounter: Payer: Self-pay | Admitting: Neurology

## 2023-06-12 ENCOUNTER — Other Ambulatory Visit: Payer: Self-pay

## 2023-06-12 MED ORDER — QULIPTA 60 MG PO TABS: 60.0000 mg | ORAL_TABLET | Freq: Every day | ORAL | 0 refills | Status: DC

## 2023-06-12 NOTE — Telephone Encounter (Signed)
Per Pharmacy PA is Needed for Qulipta.   Advised Rep PA has been started.

## 2023-06-15 ENCOUNTER — Other Ambulatory Visit: Payer: Self-pay

## 2023-06-15 ENCOUNTER — Other Ambulatory Visit: Payer: PRIVATE HEALTH INSURANCE

## 2023-06-15 ENCOUNTER — Other Ambulatory Visit (HOSPITAL_COMMUNITY): Payer: Self-pay

## 2023-06-15 DIAGNOSIS — R1013 Epigastric pain: Secondary | ICD-10-CM

## 2023-06-15 DIAGNOSIS — R1011 Right upper quadrant pain: Secondary | ICD-10-CM | POA: Diagnosis not present

## 2023-06-15 DIAGNOSIS — R112 Nausea with vomiting, unspecified: Secondary | ICD-10-CM | POA: Diagnosis not present

## 2023-06-15 NOTE — Progress Notes (Signed)
Patient to pickup samples, Medication Samples have been provided to the patient.  Drug name: Bennie Pierini       Strength: 60 mg        Qty: 8   LOT: [id number]  Exp.Date: 12/2024  Dosing instructions: daily  The patient has been instructed regarding the correct time, dose, and frequency of taking this medication, including desired effects and most common side effects.   Autumn Cortez 10:35 AM 06/15/2023

## 2023-06-16 LAB — TISSUE TRANSGLUTAMINASE ABS,IGG,IGA
(tTG) Ab, IgA: <1 U/mL
(tTG) Ab, IgG: <1 U/mL

## 2023-06-16 LAB — IGA: Immunoglobulin A: 81 mg/dL (ref 47–310)

## 2023-06-17 ENCOUNTER — Encounter: Payer: Self-pay | Admitting: Gastroenterology

## 2023-06-17 NOTE — Telephone Encounter (Signed)
Patient Advocate Encounter  Prior Authorization for Qulipta 60MG  tablets has been approved through [employer].    KeyDanella Cortez  Effective: 05-29-2023 to 05-28-2024

## 2023-06-17 NOTE — Telephone Encounter (Signed)
Patient advised.

## 2023-06-22 ENCOUNTER — Other Ambulatory Visit (HOSPITAL_COMMUNITY): Payer: Self-pay

## 2023-06-25 ENCOUNTER — Telehealth: Payer: Self-pay

## 2023-06-25 NOTE — Telephone Encounter (Signed)
*  LBN   PA request received for Qulipta 60MG  tablets  PA submitted to [employer] via CMM and has been APPROVED from 06/25/2023-06/24/2024  Key: [id number]
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
## 2023-07-06 ENCOUNTER — Ambulatory Visit (INDEPENDENT_AMBULATORY_CARE_PROVIDER_SITE_OTHER): Payer: BLUE CROSS/BLUE SHIELD | Admitting: Neurology

## 2023-07-06 ENCOUNTER — Encounter: Payer: Self-pay | Admitting: Neurology

## 2023-07-06 VITALS — BP 113/73 | HR 77 | Ht 61.0 in | Wt 110.0 lb

## 2023-07-06 DIAGNOSIS — G43009 Migraine without aura, not intractable, without status migrainosus: Secondary | ICD-10-CM | POA: Diagnosis not present

## 2023-07-06 MED ORDER — RIZATRIPTAN BENZOATE 10 MG PO TBDP: 10.0000 mg | ORAL_TABLET | ORAL | 11 refills | Status: DC | PRN

## 2023-07-06 MED ORDER — QULIPTA 60 MG PO TABS: 60.0000 mg | ORAL_TABLET | Freq: Every day | ORAL | 11 refills | Status: DC

## 2023-07-09 ENCOUNTER — Ambulatory Visit: Payer: Medicaid Other | Admitting: Physician Assistant

## 2023-07-27 ENCOUNTER — Other Ambulatory Visit: Payer: Self-pay | Admitting: Family Medicine

## 2023-08-18 ENCOUNTER — Other Ambulatory Visit: Payer: Self-pay | Admitting: Family Medicine

## 2023-08-18 DIAGNOSIS — J0140 Acute pansinusitis, unspecified: Secondary | ICD-10-CM

## 2023-09-30 ENCOUNTER — Encounter: Payer: Self-pay | Admitting: Family Medicine

## 2023-11-06 ENCOUNTER — Telehealth: Payer: Self-pay | Admitting: Pharmacy Technician

## 2023-11-06 ENCOUNTER — Other Ambulatory Visit (HOSPITAL_COMMUNITY): Payer: Self-pay

## 2023-11-06 NOTE — Telephone Encounter (Signed)
Pharmacy Patient Advocate Encounter   Received notification from CoverMyMeds that prior authorization for QULIPTA 60MG  is required/requested.   Insurance verification completed.   The patient is insured through [facility] .   Per test claim: Refill too soon. PA is not needed at this time. Medication was filled 9.7.24. Next eligible fill date is 11.21.24.

## 2023-11-25 NOTE — Telephone Encounter (Signed)
Another request for PA for Qulipta received.

## 2023-11-30 ENCOUNTER — Ambulatory Visit (INDEPENDENT_AMBULATORY_CARE_PROVIDER_SITE_OTHER): Payer: MEDICAID | Admitting: Family Medicine

## 2023-11-30 VITALS — BP 116/78 | HR 93 | Temp 98.0°F | Resp 16 | Ht 61.0 in | Wt 121.0 lb

## 2023-11-30 DIAGNOSIS — F900 Attention-deficit hyperactivity disorder, predominantly inattentive type: Secondary | ICD-10-CM

## 2023-11-30 DIAGNOSIS — H6123 Impacted cerumen, bilateral: Secondary | ICD-10-CM

## 2023-11-30 DIAGNOSIS — R110 Nausea: Secondary | ICD-10-CM

## 2023-11-30 DIAGNOSIS — F319 Bipolar disorder, unspecified: Secondary | ICD-10-CM | POA: Diagnosis not present

## 2023-11-30 DIAGNOSIS — Z23 Encounter for immunization: Secondary | ICD-10-CM | POA: Diagnosis not present

## 2023-11-30 NOTE — Patient Instructions (Addendum)
OK to use Debrox (peroxide) in the ear to loosen up wax. Also recommend using a bulb syringe (for removing boogers from baby's noses) to flush through warm water and vinegar (3-4:1 ratio). An alternative, though more expensive, is an elephant ear washer wax removal kit. Do not use Q-tips as this can impact wax further.  Give [place] 2-3 business days to get the results of your labs back.   Call Center for [facility] at [facility] at [phone] for an appointment.  They are located at [address], [place], [place], [address] (right across the hall from our office).  Let us know if you need anything.

## 2023-11-30 NOTE — Progress Notes (Signed)
Chief Complaint  Patient presents with   Follow-up    Follow up    Subjective Autumn Cortez presents for f/u Bipolar 1 disorder.  Pt is currently being treated with BuSpar 7.5 mg twice daily, Lamictal 100 mg twice daily, Seroquel 200 mg nightly as needed.  compliant Reports doing well since treatment. No thoughts of harming self or others. No self-medication with alcohol, prescription drugs or illicit drugs. Pt is not following with a counselor/psychologist.  ADHD Patient is currently on Adderall XR 20 mg/d and compliance is excellent. Symptoms are well controlled.  Side effects include none. Patient believes their dose should be not significantly changed. Denies tics, weight loss, difficulties with sleep, self-medication, alcohol/drug abuse, chest pain, or palpitations.  Patient has had some nausea and spotting over the past several days.  She had unprotected intercourse a week ago.  She is compliant with her birth control pill.  She took 3 urine pregnancy test at home and they were all negative.  Patient has a history of wax buildup.  Her right ear has been getting more full lately and she has been having more difficulty hearing.  She wonders if she needs her wax removed again.  Past Medical History:  Diagnosis Date   ADD (attention deficit disorder with hyperactivity)    Anxiety    Asthma    Bipolar 1 disorder (HCC)    Migraines    Allergies as of 11/30/2023       Reactions   Bee Pollen    Other Reaction(s): Other (See Comments) Seasonal allergies - runny nose, sneezing, watery eyes, Congestion and sneezing   Bupropion Other (See Comments)   Mood lability   Lactose Diarrhea   Pollen Extract Other (See Comments)   Seasonal allergies - runny nose, sneezing, watery eyes Congestion and sneezing        Medication List        Accurate as of November 30, 2023  1:03 PM. If you have any questions, ask your nurse or doctor.          albuterol 108 (90 Base)  MCG/ACT inhaler Commonly known as: VENTOLIN HFA Inhale 2 puffs into the lungs every 6 (six) hours as needed for shortness of breath or wheezing.   amphetamine-dextroamphetamine 20 MG 24 hr capsule Commonly known as: ADDERALL XR Take 1 capsule (20 mg total) by mouth daily. What changed:  when to take this reasons to take this   budesonide 180 MCG/ACT inhaler Commonly known as: PULMICORT Inhale 1 puff into the lungs 2 (two) times daily.   busPIRone 7.5 MG tablet Commonly known as: BUSPAR TAKE 1 TABLET BY MOUTH TWICE A DAY   fluticasone 50 MCG/ACT nasal spray Commonly known as: FLONASE Place 2 sprays into both nostrils daily.   gabapentin 300 MG capsule Commonly known as: NEURONTIN Take 1-2 capsule 3 times daily as needed for panic attacks.   hyoscyamine 0.125 MG SL tablet Commonly known as: LEVSIN SL Place 1 tablet (0.125 mg total) under the tongue every 8 (eight) hours as needed.   lamoTRIgine 100 MG tablet Commonly known as: LAMICTAL TAKE 1 TABLET BY MOUTH TWICE A DAY   montelukast 10 MG tablet Commonly known as: SINGULAIR Take 1 tablet (10 mg total) by mouth at bedtime.   Nikki 3-0.02 MG tablet Generic drug: drospirenone-ethinyl estradiol TAKE 1 TABLET BY MOUTH DAILY   omeprazole 20 MG capsule Commonly known as: PRILOSEC Take 1 capsule (20 mg total) by mouth daily for 7 days.  ondansetron 4 MG disintegrating tablet Commonly known as: ZOFRAN-ODT Take 4 mg by mouth every 8 (eight) hours as needed for nausea or vomiting.   pantoprazole 40 MG tablet Commonly known as: PROTONIX Take 1 tablet (40 mg total) by mouth daily.   QUEtiapine 200 MG tablet Commonly known as: SEROQUEL Take 1 tablet (200 mg total) by mouth at bedtime as needed (sleep). What changed: how much to take   Qulipta 60 MG Tabs Generic drug: Atogepant Take 1 tablet (60 mg total) by mouth daily at 2 PM.   rizatriptan 10 MG disintegrating tablet Commonly known as: Maxalt-MLT Take 1 tablet  (10 mg total) by mouth as needed for migraine (May repeat in 2 hours.  Maximum 2 tablets in 24 hours). May repeat in 2 hours if needed        Exam BP 116/78 (BP Location: Left Arm, Patient Position: Sitting, Cuff Size: Normal)   Pulse 93   Temp 98 F (36.7 C) (Oral)   Resp 16   Ht 5\' 1"  (1.549 m)   Wt 121 lb (54.9 kg)   SpO2 100%   BMI 22.86 kg/m  General:  well developed, well nourished, in no apparent distress Ears: Canals 100% obstructed w cerumen bilaterally.  Lungs:  No respiratory distress Psych: well oriented with normal range of affect and age-appropriate judgement/insight, alert and oriented x4.  Assessment and Plan  Bipolar 1 disorder (HCC)  ADHD (attention deficit hyperactivity disorder), inattentive type  Nausea - Plan: hCG, serum, qualitative  Bilateral impacted cerumen  Chronic, stable.  Continue Lamictal 100 mg twice daily, BuSpar 7.5 mg twice daily, Seroquel 200 mg nightly as needed. Chronic, stable.  Continue Adderall XR 20 mg daily. Check pregnancy test. Home care instructions verbalized and written down.  Irrigation today. Tdap today. She is due for cervical cancer screening.  GYN information provided in her paperwork. F/u in 6 months. The patient voiced understanding and agreement to the plan.  Jilda Roche [place], DO 11/30/23 1:03 PM

## 2023-12-01 LAB — HCG, SERUM, QUALITATIVE: Preg, Serum: NEGATIVE

## 2023-12-02 ENCOUNTER — Encounter: Payer: Self-pay | Admitting: Family Medicine

## 2023-12-04 ENCOUNTER — Telehealth: Payer: Self-pay

## 2023-12-04 ENCOUNTER — Telehealth: Payer: Self-pay | Admitting: Neurology

## 2023-12-04 MED ORDER — QULIPTA 60 MG PO TABS: 60.0000 mg | ORAL_TABLET | Freq: Every day | ORAL | 3 refills | Status: DC

## 2023-12-04 NOTE — Telephone Encounter (Signed)
Sent mychart message to pt. 

## 2023-12-04 NOTE — Telephone Encounter (Signed)
90 day supply sent to the [facility].

## 2023-12-04 NOTE — Telephone Encounter (Signed)
Pt needs a 90 day suppply of the qulipta sent to [facility] on [facility]

## 2023-12-14 ENCOUNTER — Other Ambulatory Visit (HOSPITAL_COMMUNITY): Payer: Self-pay

## 2023-12-14 NOTE — Telephone Encounter (Signed)
Not sure why it wasn't processing previously, but she was able to get a 90 day supply for $0 on 12/9.

## 2023-12-21 ENCOUNTER — Emergency Department (HOSPITAL_BASED_OUTPATIENT_CLINIC_OR_DEPARTMENT_OTHER)
Admission: EM | Admit: 2023-12-21 | Discharge: 2023-12-21 | Disposition: A | Discharge status: Home / Self Care | Payer: MEDICAID | Attending: Emergency Medicine | Admitting: Emergency Medicine

## 2023-12-21 DIAGNOSIS — N939 Abnormal uterine and vaginal bleeding, unspecified: Secondary | ICD-10-CM | POA: Insufficient documentation

## 2023-12-21 DIAGNOSIS — J45909 Unspecified asthma, uncomplicated: Secondary | ICD-10-CM | POA: Diagnosis not present

## 2023-12-21 DIAGNOSIS — R109 Unspecified abdominal pain: Secondary | ICD-10-CM | POA: Diagnosis present

## 2023-12-21 DIAGNOSIS — R1033 Periumbilical pain: Secondary | ICD-10-CM

## 2023-12-21 LAB — PREGNANCY, URINE: Preg Test, Ur: NEGATIVE

## 2023-12-21 LAB — URINALYSIS, ROUTINE W REFLEX MICROSCOPIC
Bilirubin Urine: NEGATIVE
Glucose, UA: NEGATIVE mg/dL
Hgb urine dipstick: NEGATIVE
Ketones, ur: NEGATIVE mg/dL
Leukocytes,Ua: NEGATIVE
Nitrite: NEGATIVE
Protein, ur: NEGATIVE mg/dL
Specific Gravity, Urine: 1.01 (ref 1.005–1.030)
pH: 6 (ref 5.0–8.0)

## 2023-12-21 LAB — COMPREHENSIVE METABOLIC PANEL
ALT: 15 U/L (ref 0–44)
AST: 19 U/L (ref 15–41)
Albumin: 4.2 g/dL (ref 3.5–5.0)
Alkaline Phosphatase: 51 U/L (ref 38–126)
Anion gap: 8 (ref 5–15)
BUN: 10 mg/dL (ref 6–20)
CO2: 25 mmol/L (ref 22–32)
Calcium: 9.4 mg/dL (ref 8.9–10.3)
Chloride: 102 mmol/L (ref 98–111)
Creatinine, Ser: 0.93 mg/dL (ref 0.44–1.00)
GFR, Estimated: >60 mL/min (ref >60)
Glucose, Bld: 104 mg/dL — ABNORMAL HIGH (ref 70–99)
Potassium: 4 mmol/L (ref 3.5–5.1)
Sodium: 135 mmol/L (ref 135–145)
Total Bilirubin: 0.7 mg/dL (ref <1.2)
Total Protein: 7.2 g/dL (ref 6.5–8.1)

## 2023-12-21 LAB — CBC WITH DIFFERENTIAL/PLATELET
Abs Immature Granulocytes: 0.01 10*3/uL (ref 0.00–0.07)
Basophils Absolute: 0 10*3/uL (ref 0.0–0.1)
Basophils Relative: 0 %
Eosinophils Absolute: 0 10*3/uL (ref 0.0–0.5)
Eosinophils Relative: 0 %
HCT: 44 % (ref 36.0–46.0)
Hemoglobin: 15.5 g/dL — ABNORMAL HIGH (ref 12.0–15.0)
Immature Granulocytes: 0 %
Lymphocytes Relative: 27 %
Lymphs Abs: 2.5 10*3/uL (ref 0.7–4.0)
MCH: 29.8 pg (ref 26.0–34.0)
MCHC: 35.2 g/dL (ref 30.0–36.0)
MCV: 84.6 fL (ref 80.0–100.0)
Monocytes Absolute: 0.4 10*3/uL (ref 0.1–1.0)
Monocytes Relative: 4 %
Neutro Abs: 6.4 10*3/uL (ref 1.7–7.7)
Neutrophils Relative %: 69 %
Platelets: 293 10*3/uL (ref 150–400)
RBC: 5.2 MIL/uL — ABNORMAL HIGH (ref 3.87–5.11)
RDW: 11.5 % (ref 11.5–15.5)
WBC: 9.4 10*3/uL (ref 4.0–10.5)
nRBC: 0 % (ref 0.0–0.2)

## 2023-12-21 LAB — LIPASE, BLOOD: Lipase: 24 U/L (ref 11–51)

## 2023-12-21 LAB — HCG, QUANTITATIVE, PREGNANCY: hCG, Beta Chain, Quant, S: <1 m[IU]/mL (ref <5)

## 2023-12-21 MED ORDER — DICYCLOMINE HCL 20 MG PO TABS: 20.0000 mg | ORAL_TABLET | Freq: Two times a day (BID) | ORAL | 0 refills | Status: DC | PRN

## 2023-12-21 NOTE — ED Provider Notes (Signed)
[place] [facility] Provider Note   CSN: [id number] Arrival date & time: 12/21/23  1116     History  Chief Complaint  Patient presents with   Abdominal Pain    Autumn Cortez is a 22 y.o. female with a history of bipolar 1 disorder, asthma, and ADD presents to the ED today for abdominal pain.  Patient reports intermittent vaginal bleeding for the past 5 days with associated periumbilical pain and nausea.  She reports that she is on oral birth control pills so her periods are usually regular. LMP was 2 weeks ago. She has been having intermittent spotting with blood clots and is concerned about a possible miscarriage despite multiple negative pregnancy tests. She is using 5-6 pads/tampons a day.  No fevers, vomiting, dysuria, or diarrhea.  She denies vaginal discharge, itching, or pain.  She states that her mother did not know she was pregnant until she was 3 months along, she is concerned that this could be happening to her as well.  Patient endorses history of abdominal pain in the past.  She states that she was admitted to the hospital several months ago and had an endoscopy at that time which did not show any acute findings.  Patient's significant other at bedside states that they told her her symptoms could be stress-induced.  She states abdominal pain that she is feeling now is similar to what she was feeling back then, but is more sharp this time.  She has been taking Zofran for the nausea without relief.  She denies any abdominal pain or nausea at the time of evaluation.  No additional complaints or concerns at this time.    Home Medications Prior to Admission medications   Medication Sig Start Date End Date Taking? Authorizing Provider  dicyclomine (BENTYL) 20 MG tablet Take 1 tablet (20 mg total) by mouth 2 (two) times daily as needed for up to 14 days for spasms. 12/21/23 01/04/24 Yes Maxwell Marion, PA-C  albuterol (VENTOLIN HFA) 108 (90  Base) MCG/ACT inhaler Inhale 2 puffs into the lungs every 6 (six) hours as needed for shortness of breath or wheezing. 09/19/22   Sharlene Dory, DO  amphetamine-dextroamphetamine (ADDERALL XR) 20 MG 24 hr capsule Take 1 capsule (20 mg total) by mouth daily. Patient taking differently: Take 20 mg by mouth daily as needed. 11/20/22   Wendling, Jilda Roche, DO  Atogepant (QULIPTA) 60 MG TABS Take 1 tablet (60 mg total) by mouth daily at 2 PM. 12/04/23   Everlena Cooper, Adam R, DO  budesonide (PULMICORT) 180 MCG/ACT inhaler Inhale 1 puff into the lungs 2 (two) times daily. 03/12/21   Sharlene Dory, DO  busPIRone (BUSPAR) 7.5 MG tablet TAKE 1 TABLET BY MOUTH TWICE A DAY 07/27/23   Sharlene Dory, DO  fluticasone [facility]) 50 MCG/ACT nasal spray Place 2 sprays into both nostrils daily. 04/16/20   Eulis Foster, FNP  gabapentin (NEURONTIN) 300 MG capsule Take 1-2 capsule 3 times daily as needed for panic attacks. 09/18/21   Sharlene Dory, DO  hyoscyamine (LEVSIN SL) 0.125 MG SL tablet Place 1 tablet (0.125 mg total) under the tongue every 8 (eight) hours as needed. 05/05/23   Zehr, Shanda Bumps D, PA-C  lamoTRIgine (LAMICTAL) 100 MG tablet TAKE 1 TABLET BY MOUTH TWICE A DAY 07/27/23   Wendling, Jilda Roche, DO  montelukast (SINGULAIR) 10 MG tablet Take 1 tablet (10 mg total) by mouth at bedtime. 08/18/23   Sharlene Dory,  DO  NIKKI 3-0.02 MG tablet TAKE 1 TABLET BY MOUTH DAILY 02/23/23   Sharlene Dory, DO  omeprazole (PRILOSEC) 20 MG capsule Take 1 capsule (20 mg total) by mouth daily for 7 days. 04/30/23 05/29/23  Jacalyn Lefevre, MD  ondansetron (ZOFRAN-ODT) 4 MG disintegrating tablet Take 4 mg by mouth every 8 (eight) hours as needed for nausea or vomiting. 04/30/23   [provider]  pantoprazole (PROTONIX) 40 MG tablet Take 1 tablet (40 mg total) by mouth daily. 04/22/22   Sharlene Dory, DO  QUEtiapine (SEROQUEL) 200 MG tablet Take 1 tablet (200 mg  total) by mouth at bedtime as needed (sleep). Patient taking differently: Take 0.5 tablets by mouth at bedtime as needed (sleep). 04/21/22   Sharlene Dory, DO  rizatriptan (MAXALT-MLT) 10 MG disintegrating tablet Take 1 tablet (10 mg total) by mouth as needed for migraine (May repeat in 2 hours.  Maximum 2 tablets in 24 hours). May repeat in 2 hours if needed 07/06/23   Drema Dallas, DO      Allergies    Bee pollen, Bupropion, Lactose, and Pollen extract    Review of Systems   Review of Systems  Gastrointestinal:  Positive for abdominal pain.  All other systems reviewed and are negative.   Physical Exam Updated Vital Signs BP 120/76   Pulse 95   Temp 98.3 F (36.8 C) (Oral)   Resp 16   Ht 5\' 2"  (1.575 m)   Wt 54.9 kg   LMP 12/02/2023   SpO2 100%   BMI 22.14 kg/m  Physical Exam Vitals and nursing note reviewed.  Constitutional:      General: She is not in acute distress.    Appearance: Normal appearance.  HENT:     Head: Normocephalic and atraumatic.     Mouth/Throat:     Mouth: Mucous membranes are moist.  Eyes:     Conjunctiva/sclera: Conjunctivae normal.     Pupils: Pupils are equal, round, and reactive to light.  Cardiovascular:     Rate and Rhythm: Normal rate and regular rhythm.     Pulses: Normal pulses.     Heart sounds: Normal heart sounds.  Pulmonary:     Effort: Pulmonary effort is normal.     Breath sounds: Normal breath sounds.  Abdominal:     Palpations: Abdomen is soft.     Tenderness: There is no abdominal tenderness.  Skin:    General: Skin is warm and dry.     Findings: No rash.  Neurological:     General: No focal deficit present.     Mental Status: She is alert.  Psychiatric:        Mood and Affect: Mood normal.        Behavior: Behavior normal.    ED Results / Procedures / Treatments   Labs (all labs ordered are listed, but only abnormal results are displayed) Labs Reviewed  CBC WITH DIFFERENTIAL/PLATELET - Abnormal; Notable  for the following components:      Result Value   RBC 5.20 (*)    Hemoglobin 15.5 (*)    All other components within normal limits  COMPREHENSIVE METABOLIC PANEL - Abnormal; Notable for the following components:   Glucose, Bld 104 (*)    All other components within normal limits  PREGNANCY, URINE  URINALYSIS, ROUTINE W REFLEX MICROSCOPIC  HCG, QUANTITATIVE, PREGNANCY  LIPASE, BLOOD    EKG None  Radiology No results found.  Procedures Procedures: not indicated.   Medications  Ordered in ED Medications - No data to display  ED Course/ Medical Decision Making/ A&P                                 Medical Decision Making Amount and/or Complexity of Data Reviewed Labs: ordered.   This patient presents to the ED for concern of abdominal pain and vaginal bleeding, this involves an extensive number of treatment options, and is a complaint that carries with it a high risk of complications and morbidity.   Differential diagnosis includes: IUP, ectopic pregnancy, miscarriage, AUB, IBS, IBD, GERD, pancreatitis, etc.   Comorbidities  See HPI above   Additional History  Additional history obtained from prior records.   Lab Tests  I ordered and personally interpreted labs.  The pertinent results include:   Negative urine pregnancy Quantitative hcg is <1 UA is unremarkable CMP, lipase, and CBC are within normal limits - no acute electrolyte derangement, AKI, infection, or anemia   Problem List / ED Course / Critical Interventions / Medication Management  Abdominal pain and vaginal bleeding I have reviewed the patients home medicines and have made adjustments as needed   Social Determinants of Health  Social connection   Test / Admission - Considered  Discussed findings with patient and family at bedside.  All questions were answered. She is hemodynamically stable and safe for discharge home. Information for OB/GYN provided for reevaluation. Return precautions  given.       Final Clinical Impression(s) / ED Diagnoses Final diagnoses:  Vaginal bleeding  Periumbilical abdominal pain    Rx / DC Orders ED Discharge Orders          Ordered    dicyclomine (BENTYL) 20 MG tablet  2 times daily PRN        12/21/23 1338              Maxwell Marion, PA-C 12/21/23 1354    Elayne Snare K, DO 12/21/23 1423

## 2023-12-21 NOTE — ED Notes (Signed)
Pt. Reports this is not her normal period and she has had abnormal vaginal bleeding.  Pt. Reports she has not had a positive preg test.  Pt. Is reporting clots with the bleeding and concerned about poss. Miscarriage even though preg test negative.

## 2023-12-21 NOTE — ED Triage Notes (Signed)
Pt reports that was seen upstairs  at her doctor and they told her to come to ED  for ultrasound. Thinks she could be having a miscarriage. Reports bleeding started Thursday. Reports nausea.

## 2023-12-21 NOTE — Discharge Instructions (Addendum)
As discussed, your labs are reassuring.  I have sent a prescription of Bentyl to your pharmacy.  You can take this up to twice a day as needed for abdominal pain.  Continue taking ondansetron as needed for Zofran.  Additionally, I provided information for OB/GYN.  Call their office to schedule an appointment for evaluation of your symptoms.  Get help right away if: You faint. You have to change pads every hour. You have pain in your belly. You have a fever or chills. You get sweaty or weak. You pass large blood clots from your vagina.

## 2023-12-28 ENCOUNTER — Other Ambulatory Visit: Payer: Self-pay | Admitting: Family Medicine

## 2023-12-28 DIAGNOSIS — Z3041 Encounter for surveillance of contraceptive pills: Secondary | ICD-10-CM

## 2024-01-27 ENCOUNTER — Other Ambulatory Visit: Payer: Self-pay | Admitting: Family Medicine

## 2024-02-12 ENCOUNTER — Ambulatory Visit: Payer: MEDICAID | Admitting: Obstetrics and Gynecology

## 2024-02-12 ENCOUNTER — Other Ambulatory Visit (HOSPITAL_COMMUNITY)
Admission: RE | Admit: 2024-02-12 | Discharge: 2024-02-12 | Disposition: A | Discharge status: Home / Self Care | Payer: MEDICAID | Source: Ambulatory Visit | Attending: Obstetrics and Gynecology | Admitting: Obstetrics and Gynecology

## 2024-02-12 VITALS — BP 126/83 | HR 87 | Ht 62.0 in | Wt 112.0 lb

## 2024-02-12 DIAGNOSIS — Z124 Encounter for screening for malignant neoplasm of cervix: Secondary | ICD-10-CM

## 2024-02-12 DIAGNOSIS — Z01419 Encounter for gynecological examination (general) (routine) without abnormal findings: Secondary | ICD-10-CM

## 2024-02-12 DIAGNOSIS — Z3041 Encounter for surveillance of contraceptive pills: Secondary | ICD-10-CM

## 2024-02-12 MED ORDER — DROSPIRENONE-ETHINYL ESTRADIOL 3-0.02 MG PO TABS: 1.0000 | ORAL_TABLET | Freq: Every day | ORAL | 4 refills | Status: AC

## 2024-02-12 NOTE — Progress Notes (Signed)
ANNUAL EXAM Patient name: Autumn Cortez MRN [id number]  Date of birth: Oct 14, 2001 Chief Complaint:   Gynecologic Exam  History of Present Illness:   Autumn Cortez is a 23 y.o. G0P0000 being seen today for a routine annual exam.  Current complaints: pap  Discussed the use of AI scribe software for clinical note transcription with the patient, who gave verbal consent to proceed.  History of Present Illness   Autumn Cortez is a 23 year old female who presents for an annual exam with a Pap smear.  She is interested in screening for sexually transmitted infections, including chlamydia, due to a previous chlamydia infection in 2020 or 2021.  She has regular monthly menstrual cycles without any issues. She is currently sexually active with a female partner and does not experience any pain during intercourse.  She is using Congo, a birth control pill, and reports no problems with it.  In her family history, her paternal grandmother and aunt have had breast cancer. She has been tested for genetic predisposition to breast cancer and was informed that she does not carry the associated gene. No breast or nipple changes.       Patient's last menstrual period was 01/30/2024.   The pregnancy intention screening data noted above was reviewed. Potential methods of contraception were discussed. The patient elected to proceed with No data recorded.   Last pap No results found for: "DIAGPAP", "HPVHIGH", "ADEQPAP" Last mammogram: n/a.  Last colonoscopy: n/a.      02/12/2024   10:05 AM 11/30/2023   12:48 PM 11/30/2023   12:47 PM 04/22/2023    1:05 PM 09/15/2022   10:44 AM  Depression screen PHQ 2/9  Decreased Interest 0 0 0 0 0  Down, Depressed, Hopeless  0 0 3 0  PHQ - 2 Score 0 0 0 3 0  Altered sleeping  0  2   Tired, decreased energy 0 0     Change in appetite 0 0  2   Feeling bad or failure about yourself  0 0  0   Trouble concentrating 0 0  0   Moving slowly or  fidgety/restless  0  0   Suicidal thoughts 0 0  0   PHQ-9 Score  0  7   Difficult doing work/chores  Not difficult at all  Not difficult at all         02/12/2024   10:05 AM  GAD 7 : Generalized Anxiety Score  Nervous, Anxious, on Edge 0  Control/stop worrying 0  Worry too much - different things 0  Trouble relaxing 0  Restless 0  Afraid - awful might happen 0     Review of Systems:   Pertinent items are noted in HPI Denies any headaches, blurred vision, fatigue, shortness of breath, chest pain, abdominal pain, abnormal vaginal discharge/itching/odor/irritation, problems with periods, bowel movements, urination, or intercourse unless otherwise stated above. Pertinent History Reviewed:  Reviewed past medical,surgical, social and family history.  Reviewed problem list, medications and allergies. Physical Assessment:   Vitals:   02/12/24 0955  BP: 126/83  Pulse: 87  Weight: 112 lb (50.8 kg)  Height: 5\' 2"  (1.575 m)  Body mass index is 20.49 kg/m.        Physical Examination:   General appearance - well appearing, and in no distress  Mental status - alert, oriented to person, place, and time  Psych:  She has a normal mood and affect  Skin - warm and dry,  normal color, no suspicious lesions noted  Chest - effort normal, all lung fields clear to auscultation bilaterally  Heart - normal rate and regular rhythm  Breasts - breasts appear normal, no suspicious masses, no skin or nipple changes or  axillary nodes  Abdomen - soft, nontender, nondistended, no masses or organomegaly  Pelvic -  VULVA: normal appearing vulva with no masses, tenderness or lesions   VAGINA: normal appearing vagina with normal color and discharge, no lesions   CERVIX: normal appearing cervix without discharge or lesions, no CMT  Thin prep pap is done with HR HPV cotesting  UTERUS: uterus is felt to be normal size, shape, consistency and nontender   ADNEXA: No adnexal masses or tenderness  noted.  Extremities:  No swelling or varicosities noted  Chaperone present for exam  No results found for this or any previous visit (from the past 24 hours).    Assessment & Plan:   Assessment and Plan    Annual Well Woman Exam First Pap smear and pelvic exam performed. Explained the purpose and process of the Pap smear. No issues with menstrual cycles. No pain with intercourse. -Performed Pap smear and pelvic exam. -Expect results in a few days.  Sexually Transmitted Infection (STI) Screening History of Chlamydia in 2020 or 2021. -Performed Chlamydia screening. -Expect results in a few days.  Contraception Currently using Nikki (birth control pill) with no issues. -Continue current contraceptive method.  Family History of Breast Cancer Maternal grandmother and aunt had breast cancer. Patient tested negative for BRCA gene. -Performed breast exam with no abnormalities noted.  General Health Maintenance -Advised on hand hygiene and flu prevention.        No orders of the defined types were placed in this encounter.   Meds: No orders of the defined types were placed in this encounter.   Follow-up: No follow-ups on file.  Lorriane Shire, MD 02/12/2024 10:27 AM

## 2024-02-16 ENCOUNTER — Other Ambulatory Visit: Payer: Self-pay | Admitting: Family Medicine

## 2024-02-16 DIAGNOSIS — J0140 Acute pansinusitis, unspecified: Secondary | ICD-10-CM

## 2024-02-16 LAB — CYTOLOGY - PAP
Chlamydia: NEGATIVE
Comment: NEGATIVE
Comment: NEGATIVE
Comment: NEGATIVE
Comment: NORMAL
High risk HPV: POSITIVE — AB
Neisseria Gonorrhea: NEGATIVE
Trichomonas: NEGATIVE

## 2024-02-17 ENCOUNTER — Telehealth: Payer: Self-pay

## 2024-02-17 ENCOUNTER — Encounter: Payer: Self-pay | Admitting: Obstetrics and Gynecology

## 2024-02-17 NOTE — Telephone Encounter (Signed)
Patient called wanting to know next steps for her abnormal pap smear. Informed patient that we will contact her after her pap has been reviewed by the provider. A message was sent to the provider. Understanding was voiced. Maury Groninger l Jonessa Triplett, CMA

## 2024-02-22 ENCOUNTER — Telehealth: Payer: Self-pay

## 2024-02-22 NOTE — Telephone Encounter (Signed)
 PA needed for qulipta

## 2024-03-02 ENCOUNTER — Telehealth: Payer: Self-pay

## 2024-03-02 NOTE — Telephone Encounter (Signed)
 PA request has been Submitted. New Encounter has been or will be created for follow up. For additional info see Pharmacy Prior Auth telephone encounter from 03/05.

## 2024-03-02 NOTE — Telephone Encounter (Signed)
*  [facility]  Pharmacy Patient Advocate Encounter   Received notification from Pt Calls Messages that prior authorization for Qulipta 60MG  tablets  is required/requested.   Insurance verification completed.   The patient is insured through [facility] .   Per test claim: PA required; PA submitted to above mentioned insurance via CoverMyMeds Key/confirmation #/EOC [id number] Status is pending

## 2024-03-02 NOTE — Telephone Encounter (Signed)
 Pt called back in checking on PA for [facility]

## 2024-03-03 NOTE — Telephone Encounter (Signed)
 Pharmacy Patient Advocate Encounter  Received notification from [facility] that Prior Authorization for  Qulipta 60MG  tablets  has been DENIED.  Full denial letter will be uploaded to the media tab. See denial reason below.

## 2024-03-04 ENCOUNTER — Other Ambulatory Visit: Payer: Self-pay | Admitting: Neurology

## 2024-03-04 MED ORDER — AIMOVIG 140 MG/ML ~~LOC~~ SOAJ: 140.0000 mg | SUBCUTANEOUS | 5 refills | Status: DC

## 2024-03-04 NOTE — Telephone Encounter (Signed)
 Patient may not qualify for Clayton Lefort due to only having 3 migraines per month at baseline

## 2024-03-07 ENCOUNTER — Other Ambulatory Visit: Payer: Self-pay

## 2024-03-07 MED ORDER — AIMOVIG 140 MG/ML ~~LOC~~ SOAJ: 140.0000 mg | SUBCUTANEOUS | 5 refills | Status: AC

## 2024-03-07 NOTE — Progress Notes (Signed)
 PER patient Which [employer] [facility] did you send it to? I need it sent to [address], [place].  Okay, thank you!   Change location.

## 2024-03-08 ENCOUNTER — Telehealth: Payer: Self-pay

## 2024-03-08 NOTE — Telephone Encounter (Signed)
 PA needed for Aimovig.

## 2024-03-24 ENCOUNTER — Telehealth: Payer: Self-pay | Admitting: Pharmacy Technician

## 2024-03-24 ENCOUNTER — Other Ambulatory Visit (HOSPITAL_COMMUNITY): Payer: Self-pay

## 2024-03-24 DIAGNOSIS — G43009 Migraine without aura, not intractable, without status migrainosus: Secondary | ICD-10-CM

## 2024-03-24 NOTE — Telephone Encounter (Signed)
 Pharmacy Patient Advocate Encounter   Received notification from Patient Advice Request messages that prior authorization for AIMOVIG 140MG  is required/requested.   Insurance verification completed.   The patient is insured through [facility] .   Per test claim: PA required; PA submitted to above mentioned insurance via CoverMyMeds Key/confirmation #/EOC [id number] Status is pending

## 2024-03-24 NOTE — Telephone Encounter (Signed)
 PA has been submitted, and telephone encounter has been created. Please see telephone encounter dated 3.27.25.

## 2024-03-25 NOTE — Telephone Encounter (Signed)
 Copied from CRM [id number]. Topic: Referral - Request for Referral >> Mar 25, 2024 10:16 AM Florestine Avers wrote: Did the patient discuss referral with their provider in the last year? Yes (If No - schedule appointment) (If Yes - send message)  Appointment offered? No  Type of order/referral and detailed reason for visit: Referral for Neurology  Preference of office, provider, location: [facility]  If referral order, have you been seen by this specialty before? Yes (If Yes, this issue or another issue? When? Where?  Can we respond through MyChart? Yes

## 2024-03-26 NOTE — Telephone Encounter (Signed)
 For migraines? She wants 2nd opinion from [facility] or location convenience? [facility] is going to see she has a neurologist and will want to know why.

## 2024-03-28 NOTE — Telephone Encounter (Signed)
 Called patient and no answer left message to return call.

## 2024-03-28 NOTE — Telephone Encounter (Signed)
 Pharmacy Patient Advocate Encounter  Received notification from [facility] that Prior Authorization for AIMOVIG 140MG  has been APPROVED from 03-25-2024 to 06-25-2024   PA #/Case ID/Reference #: [id number]

## 2024-03-28 NOTE — Addendum Note (Signed)
 Addended by: Cheron Every C on: 03/28/2024 05:05 PM   Modules accepted: Orders

## 2024-03-28 NOTE — Telephone Encounter (Signed)
 Referral placed.

## 2024-04-24 ENCOUNTER — Other Ambulatory Visit: Payer: Self-pay | Admitting: Family Medicine

## 2024-05-30 ENCOUNTER — Ambulatory Visit (INDEPENDENT_AMBULATORY_CARE_PROVIDER_SITE_OTHER): Payer: MEDICAID | Admitting: Family Medicine

## 2024-05-30 ENCOUNTER — Encounter: Payer: Self-pay | Admitting: Family Medicine

## 2024-05-30 VITALS — BP 122/78 | HR 89 | Temp 98.0°F | Resp 16 | Ht 62.0 in | Wt 123.0 lb

## 2024-05-30 DIAGNOSIS — F319 Bipolar disorder, unspecified: Secondary | ICD-10-CM | POA: Diagnosis not present

## 2024-05-30 DIAGNOSIS — Z0000 Encounter for general adult medical examination without abnormal findings: Secondary | ICD-10-CM

## 2024-05-30 DIAGNOSIS — G43009 Migraine without aura, not intractable, without status migrainosus: Secondary | ICD-10-CM | POA: Diagnosis not present

## 2024-05-30 MED ORDER — QUETIAPINE FUMARATE 100 MG PO TABS: 200.0000 mg | ORAL_TABLET | Freq: Every day | ORAL | 1 refills | Status: AC

## 2024-05-30 MED ORDER — ONDANSETRON 4 MG PO TBDP: 4.0000 mg | ORAL_TABLET | Freq: Three times a day (TID) | ORAL | 1 refills | Status: AC | PRN

## 2024-05-30 NOTE — Progress Notes (Signed)
 Chief Complaint  Patient presents with   Annual Exam    CPE     Well Woman Autumn Cortez is here for a complete physical.   Her last physical was >1 year ago.  Current diet: in general, a "healthy" diet. Current exercise: none. Fatigue out of ordinary? No Seatbelt? Yes Advanced directive? No  Health Maintenance Pap/HPV- Yes Tetanus- Yes HIV screening- Yes Hep C screening- Yes  Past Medical History:  Diagnosis Date   ADD (attention deficit disorder with hyperactivity)    Anxiety    Asthma    Bipolar 1 disorder (HCC)    Migraines      Past Surgical History:  Procedure Laterality Date   ADENOIDECTOMY  07/29/2010   TONSILLECTOMY  2011   UPPER GASTROINTESTINAL ENDOSCOPY  2013   WISDOM TOOTH EXTRACTION      Medications  Current Outpatient Medications on File Prior to Visit  Medication Sig Dispense Refill   albuterol  (VENTOLIN  HFA) 108 (90 Base) MCG/ACT inhaler Inhale 2 puffs into the lungs every 6 (six) hours as needed for shortness of breath or wheezing. 18 g 5   budesonide  (PULMICORT ) 180 MCG/ACT inhaler Inhale 1 puff into the lungs 2 (two) times daily. 3 each 2   busPIRone  (BUSPAR ) 7.5 MG tablet TAKE 1 TABLET BY MOUTH TWICE A DAY 60 tablet 5   dicyclomine  (BENTYL ) 20 MG tablet Take 1 tablet (20 mg total) by mouth 2 (two) times daily as needed for up to 14 days for spasms. 28 tablet 0   drospirenone -ethinyl estradiol  (NIKKI ) 3-0.02 MG tablet Take 1 tablet by mouth daily. 84 tablet 4   Erenumab -aooe (AIMOVIG ) 140 MG/ML SOAJ Inject 140 mg into the skin every 28 (twenty-eight) days. 1.12 mL 5   fluticasone  (FLONASE ) 50 MCG/ACT nasal spray Place 2 sprays into both nostrils daily. 16 g 1   gabapentin  (NEURONTIN ) 300 MG capsule Take 1-2 capsule 3 times daily as needed for panic attacks. 30 capsule 3   hyoscyamine  (LEVSIN SL) 0.125 MG SL tablet Place 1 tablet (0.125 mg total) under the tongue every 8 (eight) hours as needed. 30 tablet 0   lamoTRIgine  (LAMICTAL ) 100 MG  tablet TAKE 1 TABLET BY MOUTH TWICE A DAY 180 tablet 2   montelukast  (SINGULAIR ) 10 MG tablet Take 1 tablet (10 mg total) by mouth at bedtime. 90 tablet 1   omeprazole  (PRILOSEC) 20 MG capsule Take 1 capsule (20 mg total) by mouth daily for 7 days. 7 capsule 0   ondansetron  (ZOFRAN -ODT) 4 MG disintegrating tablet Take 4 mg by mouth every 8 (eight) hours as needed for nausea or vomiting.     pantoprazole  (PROTONIX ) 40 MG tablet Take 1 tablet (40 mg total) by mouth daily. 30 tablet 1   QUEtiapine  (SEROQUEL ) 200 MG tablet Take 1 tablet (200 mg total) by mouth at bedtime as needed (sleep). (Patient taking differently: Take 0.5 tablets by mouth at bedtime as needed (sleep).) 90 tablet 2   rizatriptan  (MAXALT -MLT) 10 MG disintegrating tablet Take 1 tablet (10 mg total) by mouth as needed for migraine (May repeat in 2 hours.  Maximum 2 tablets in 24 hours). May repeat in 2 hours if needed 10 tablet 11    Allergies Allergies  Allergen Reactions   Bee Pollen     Other Reaction(s): Other (See Comments)  Seasonal allergies - runny nose, sneezing, watery eyes, Congestion and sneezing   Bupropion  Other (See Comments)    Mood lability   Lactose Diarrhea   Pollen Extract Other (  See Comments)    Seasonal allergies - runny nose, sneezing, watery eyes Congestion and sneezing    Review of Systems: Constitutional:  no unexpected weight changes Eye:  no recent significant change in vision Ear/Nose/Mouth/Throat:  Ears:  no tinnitus or vertigo and no recent change in hearing Nose/Mouth/Throat:  no complaints of nasal congestion, no sore throat Cardiovascular: no chest pain Respiratory:  no cough and no shortness of breath Gastrointestinal:  no abdominal pain, no change in bowel habits GU:  Female: negative for dysuria or pelvic pain Musculoskeletal/Extremities:  no pain of the joints Integumentary (Skin/Breast):  no abnormal skin lesions reported Neurologic:  no headaches Endocrine:  denies  fatigue Hematologic/Lymphatic:  No areas of easy bleeding  Exam BP 122/78 (BP Location: Left Arm, Patient Position: Sitting)   Pulse 89   Temp 98 F (36.7 C) (Oral)   Resp 16   Ht 5\' 2"  (1.575 m)   Wt 123 lb (55.8 kg)   SpO2 99%   BMI 22.50 kg/m  General:  well developed, well nourished, in no apparent distress Skin:  no significant moles, warts, or growths Head:  no masses, lesions, or tenderness Eyes:  pupils equal and round, sclera anicteric without injection Ears:  canals without lesions, TMs shiny without retraction, no obvious effusion, no erythema Nose:  nares patent, mucosa normal, and no drainage  Throat/Pharynx:  lips and gingiva without lesion; tongue and uvula midline; non-inflamed pharynx; no exudates or postnasal drainage Neck: neck supple without adenopathy, thyromegaly, or masses Lungs:  clear to auscultation, breath sounds equal bilaterally, no respiratory distress Cardio:  regular rate and rhythm, no bruits, no LE edema Abdomen:  abdomen soft, nontender; bowel sounds normal; no masses or organomegaly Genital: Defer to GYN Musculoskeletal:  symmetrical muscle groups noted without atrophy or deformity Extremities:  no clubbing, cyanosis, or edema, no deformities, no skin discoloration Neuro:  gait normal; deep tendon reflexes normal and symmetric Psych: well oriented with normal range of affect and appropriate judgment/insight  Assessment and Plan  Well adult exam - Plan: CBC, Comprehensive metabolic panel with GFR, Lipid panel  Bipolar 1 disorder (HCC) - Plan: Ambulatory referral to Psychiatry  Migraine without aura and without status migrainosus, not intractable - Plan: Ambulatory referral to Neurology   Well 23 y.o. female. Counseled on diet and exercise. Advanced directive form provided today.  Referrals as above as she has moved to [facility].  Other orders as above. Follow up in 6 mo. The patient voiced understanding and agreement to the  plan.  Shellie Dials [place], DO 05/30/24 1:07 PM

## 2024-05-30 NOTE — Patient Instructions (Addendum)
 Give [place] 2-3 business days to get the results of your labs back.   Keep the diet clean and stay active.  Aim to do some physical exertion for 150 minutes per week. This is typically divided into 5 days per week, 30 minutes per day. The activity should be enough to get your heart rate up. Anything is better than nothing if you have time constraints.  Please get me a copy of your advanced directive form at your convenience.   If you do not hear anything about your referral in the next 1-2 weeks, call our office and ask for an update.  Let us know if you need anything.

## 2024-05-31 ENCOUNTER — Ambulatory Visit: Payer: Self-pay | Admitting: Family Medicine

## 2024-05-31 LAB — CBC
HCT: 42.7 % (ref 36.0–46.0)
Hemoglobin: 14.2 g/dL (ref 12.0–15.0)
MCHC: 33.4 g/dL (ref 30.0–36.0)
MCV: 87.7 fl (ref 78.0–100.0)
Platelets: 271 10*3/uL (ref 150.0–400.0)
RBC: 4.87 Mil/uL (ref 3.87–5.11)
RDW: 12.2 % (ref 11.5–15.5)
WBC: 6.4 10*3/uL (ref 4.0–10.5)

## 2024-05-31 LAB — COMPREHENSIVE METABOLIC PANEL WITH GFR
ALT: 11 U/L (ref 0–35)
AST: 15 U/L (ref 0–37)
Albumin: 4.1 g/dL (ref 3.5–5.2)
Alkaline Phosphatase: 58 U/L (ref 39–117)
BUN: 9 mg/dL (ref 6–23)
CO2: 26 meq/L (ref 19–32)
Calcium: 9.6 mg/dL (ref 8.4–10.5)
Chloride: 108 meq/L (ref 96–112)
Creatinine, Ser: 0.85 mg/dL (ref 0.40–1.20)
GFR: 97.54 mL/min (ref >60.00)
Glucose, Bld: 85 mg/dL (ref 70–99)
Potassium: 4 meq/L (ref 3.5–5.1)
Sodium: 140 meq/L (ref 135–145)
Total Bilirubin: 0.4 mg/dL (ref 0.2–1.2)
Total Protein: 6.2 g/dL (ref 6.0–8.3)

## 2024-05-31 LAB — LIPID PANEL
Cholesterol: 175 mg/dL (ref 0–200)
HDL: 73.2 mg/dL (ref >39.00)
LDL Cholesterol: 62 mg/dL (ref 0–99)
NonHDL: 101.87
Total CHOL/HDL Ratio: 2
Triglycerides: 197 mg/dL — ABNORMAL HIGH (ref 0.0–149.0)
VLDL: 39.4 mg/dL (ref 0.0–40.0)

## 2024-06-01 ENCOUNTER — Telehealth: Payer: Self-pay

## 2024-06-01 NOTE — Telephone Encounter (Signed)
 PA needed

## 2024-06-06 ENCOUNTER — Telehealth: Payer: Self-pay

## 2024-06-06 ENCOUNTER — Other Ambulatory Visit (HOSPITAL_COMMUNITY): Payer: Self-pay

## 2024-06-06 NOTE — Telephone Encounter (Signed)
 PA request has been Submitted. New Encounter has been or will be created for follow up. For additional info see Pharmacy Prior Auth telephone encounter from 06/06/2024.

## 2024-06-06 NOTE — Telephone Encounter (Signed)
 Pharmacy Patient Advocate Encounter   Received notification from Pt Calls Messages that prior authorization for Aimovig  140MG /ML auto-injectors is required/requested.   Insurance verification completed.   The patient is insured through [facility].   Per test claim: PA required; PA submitted to above mentioned insurance via CoverMyMeds Key/confirmation #/EOC [id number] Status is pending

## 2024-06-07 NOTE — Telephone Encounter (Signed)
 Message left by [employer] please give them a call at [phone]

## 2024-06-07 NOTE — Telephone Encounter (Signed)
 Pharmacy Patient Advocate Encounter  Additional information has been requested from the patient's insurance in order to proceed with the prior authorization request. Requested information has been sent, or form has been filled out and faxed back to [facility] [employer]

## 2024-06-08 NOTE — Telephone Encounter (Signed)
 Pharmacy Patient Advocate Encounter   Received notification from [facility] that Prior Authorization for Aimovig  140MG /ML auto-injectors has been APPROVED from 06-07-2024 to 09-07-2024   PA #/Case ID/Reference #: [id number]
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
## 2024-07-05 ENCOUNTER — Ambulatory Visit: Payer: MEDICAID | Admitting: Neurology

## 2024-08-22 ENCOUNTER — Other Ambulatory Visit: Payer: Self-pay | Admitting: Family Medicine

## 2024-08-22 DIAGNOSIS — J0140 Acute pansinusitis, unspecified: Secondary | ICD-10-CM

## 2024-08-23 ENCOUNTER — Telehealth: Payer: Self-pay

## 2024-08-23 NOTE — Telephone Encounter (Signed)
 Copied from CRM [id number]. Topic: General - Other >> Aug 23, 2024  2:05 PM Mesmerise C wrote: Reason for CRM: Carliss from [facility] saw patient for migraine and under good control inquiring is provider can take over prescription for amovig can be reached at [phone] and also stated sent fax regarding results

## 2024-08-23 NOTE — Telephone Encounter (Signed)
 Called spoke with pt was advised of request from her Neurology migraine medication and Pt stated that  Her Neurology said they would be reaching out. Pt said if she have any question @ the medication would ok to call  Dr.Wendling,  pt was advise yes but any changes may need appt to discuss.

## 2024-08-23 NOTE — Telephone Encounter (Signed)
 OK. Requesting grace from pt and family if there are insurance issues when I take over.

## 2024-10-06 ENCOUNTER — Other Ambulatory Visit (HOSPITAL_COMMUNITY): Payer: Self-pay

## 2024-10-06 ENCOUNTER — Telehealth: Payer: Self-pay | Admitting: Pharmacy Technician

## 2024-10-06 NOTE — Telephone Encounter (Signed)
 Pharmacy Patient Advocate Encounter   Received notification from CoverMyMeds that prior authorization for AIMOVIG  140MG  is required/requested.   Insurance verification completed.   The patient is insured through [facility].   Per test claim: PA required; PA submitted to above mentioned insurance via Phone Key/confirmation #/EOC [id number] Status is pending  Called to submit PA via phone. Unable to submit via Latent.

## 2024-10-07 NOTE — Telephone Encounter (Signed)
 Pharmacy Patient Advocate Encounter  Received notification from [facility] MEDICAID that Prior Authorization for AIMOVIG  140MG  has been APPROVED from 10.9.25 to 10.9.26   PA #/Case ID/Reference #: [id number]

## 2024-10-31 ENCOUNTER — Telehealth: Payer: Self-pay

## 2024-10-31 ENCOUNTER — Other Ambulatory Visit: Payer: Self-pay

## 2024-10-31 MED ORDER — BUSPIRONE HCL 7.5 MG PO TABS: 7.5000 mg | ORAL_TABLET | Freq: Two times a day (BID) | ORAL | 5 refills | Status: DC

## 2024-10-31 NOTE — Telephone Encounter (Signed)
 Refill sent.

## 2024-10-31 NOTE — Telephone Encounter (Signed)
 Spoke with pt and she would like to discuss coming off her medications. I have scheduled her a OV to discuss this with PCP. Called pharmacy to cancel this refill

## 2024-11-02 ENCOUNTER — Encounter: Payer: Self-pay | Admitting: Family Medicine

## 2024-11-02 ENCOUNTER — Ambulatory Visit: Payer: MEDICAID | Admitting: Family Medicine

## 2024-11-02 VITALS — BP 124/76 | HR 100 | Temp 98.0°F | Resp 16 | Ht 62.0 in | Wt 132.0 lb

## 2024-11-02 DIAGNOSIS — F410 Panic disorder [episodic paroxysmal anxiety] without agoraphobia: Secondary | ICD-10-CM

## 2024-11-02 DIAGNOSIS — F411 Generalized anxiety disorder: Secondary | ICD-10-CM | POA: Diagnosis not present

## 2024-11-02 DIAGNOSIS — G43009 Migraine without aura, not intractable, without status migrainosus: Secondary | ICD-10-CM

## 2024-11-02 DIAGNOSIS — F319 Bipolar disorder, unspecified: Secondary | ICD-10-CM | POA: Diagnosis not present

## 2024-11-02 NOTE — Patient Instructions (Signed)
 Consider 200-500 mg of magnesium  for headache management during pregnancy.   Take 1 tab of the BuSpar  daily for 1 week and then stop.   Let us  know if you need anything.

## 2024-11-02 NOTE — Progress Notes (Signed)
 Chief Complaint  Patient presents with   Medication Refill    Medication refill    Subjective: Patient is a 23 y.o. female here for medication discussion.  She is seeing a psychiatrist for her bipolar disorder.  She is weaning down on her Lamictal  and currently taking 100 mg of extended release.  She is hoping to follow-up and come off of it altogether.  She is also taking Seroquel  300 mg nightly.  No adverse effects.  She is currently taking a birth control pill but is getting married soon and would like to have children eventually.  She has a history of anxiety.  She is taking BuSpar  7.5 mg twice daily.  She reports compliance no adverse effects.  Since moving out and interviewing place, she reports significant improvement in her mood.  No homicidal or suicidal ideation.  She would like to come off of this medication.  She has a history of migraines and was seeing neurology in the past.  She is currently taking Aimovig  140 mg monthly and Maxalt .  She has not used Maxalt  in quite some time.  The improvement in anxiety has improved for headaches as well.  She is interested in coming off the Aimovig  as well.  She does not wish to go back to her neurologist for now.  Past Medical History:  Diagnosis Date   ADD (attention deficit disorder with hyperactivity)    Anxiety    Asthma    Bipolar 1 disorder (HCC)    Migraines     Objective: BP 124/76 (BP Location: Left Arm, Patient Position: Sitting)   Pulse 100   Temp 98 F (36.7 C) (Oral)   Resp 16   Ht 5' 2 (1.575 m)   Wt 132 lb (59.9 kg)   SpO2 98%   BMI 24.14 kg/m  General: Awake, appears stated age Heart: RRR, no LE edema Lungs: CTAB, no rales, wheezes or rhonchi. No accessory muscle use Psych: Age appropriate judgment and insight, normal affect and mood  Assessment and Plan: Bipolar 1 disorder (HCC)  Generalized anxiety disorder with panic attacks  Migraine without aura and without status migrainosus, not  intractable  Appreciate psychiatry input.  She is weaning down on Lamictal  and will hopefully come off of Seroquel  under their guidance as well. Chronic, stable.  She will take 1 tablet of BuSpar  7.5 mg daily for 7 days and then stop. Chronic, currently stable.  We will reach out to the pharmacy team to discuss coming off of Aimovig , consider transitioning to magnesium  over-the-counter.  She has not needed her abortive therapy, likely due to improving her mood after moving out. The patient voiced understanding and agreement to the plan.  Mabel [place], DO 11/02/24  2:47 PM

## 2024-11-21 ENCOUNTER — Encounter: Payer: Self-pay | Admitting: Family Medicine

## 2024-11-23 ENCOUNTER — Ambulatory Visit: Payer: MEDICAID | Admitting: Family Medicine

## 2024-12-12 ENCOUNTER — Other Ambulatory Visit (HOSPITAL_COMMUNITY): Payer: Self-pay

## 2025-01-12 ENCOUNTER — Encounter: Payer: Self-pay | Admitting: Family Medicine

## 2025-01-13 ENCOUNTER — Encounter: Payer: Self-pay | Admitting: Family Medicine

## 2025-01-13 ENCOUNTER — Ambulatory Visit: Payer: MEDICAID | Admitting: Family Medicine

## 2025-01-13 VITALS — BP 126/74 | HR 98 | Temp 98.0°F | Resp 16 | Ht 62.0 in | Wt 128.2 lb

## 2025-01-13 DIAGNOSIS — J4521 Mild intermittent asthma with (acute) exacerbation: Secondary | ICD-10-CM

## 2025-01-13 DIAGNOSIS — J4530 Mild persistent asthma, uncomplicated: Secondary | ICD-10-CM

## 2025-01-13 DIAGNOSIS — J0100 Acute maxillary sinusitis, unspecified: Secondary | ICD-10-CM

## 2025-01-13 LAB — POC COVID19/FLU A&B COMBO
Covid Antigen, POC: NEGATIVE
Influenza A Antigen, POC: NEGATIVE
Influenza B Antigen, POC: NEGATIVE

## 2025-01-13 MED ORDER — AIRSUPRA 90-80 MCG/ACT IN AERO: 2.0000 | INHALATION_SPRAY | Freq: Four times a day (QID) | RESPIRATORY_TRACT | 2 refills | Status: AC | PRN

## 2025-01-13 MED ORDER — METHYLPREDNISOLONE ACETATE 80 MG/ML IJ SUSP
80.0000 mg | Freq: Once | INTRAMUSCULAR | Status: AC
  Administered 2025-01-13: 80 mg via INTRAMUSCULAR

## 2025-01-13 MED ORDER — ALBUTEROL SULFATE 0.63 MG/3ML IN NEBU: 1.0000 | INHALATION_SOLUTION | Freq: Four times a day (QID) | RESPIRATORY_TRACT | 12 refills | Status: AC | PRN

## 2025-01-13 NOTE — Telephone Encounter (Signed)
 Patient has called back wanting to know if she can take benadryl after having the shot today. She also sent a mychart message

## 2025-01-13 NOTE — Patient Instructions (Signed)
 Continue to push fluids, practice good hand hygiene, and cover your mouth if you cough.  If you start having fevers, shaking or shortness of breath, seek immediate care.  OK to take Tylenol  1000 mg (2 extra strength tabs) or 975 mg (3 regular strength tabs) every 6 hours as needed.  Send me a message in a couple days if not obviously better.  Let us  know if you need anything.

## 2025-01-13 NOTE — Progress Notes (Signed)
 Chief Complaint  Patient presents with   Cough    Cough    [name] here for URI complaints.  Duration: 3 days  Associated symptoms: subjective fever, sinus headache, sinus congestion, sinus pain, rhinorrhea, itchy watery eyes, ear pain, sore throat, shortness of breath, chest tightness, myalgia, and coughing, fatigue, sneezing Denies: ear drainage and wheezing Treatment to date: Equate Cold/Flu Sick contacts: Yes- was at a funeral with many other sick ppl there  Past Medical History:  Diagnosis Date   ADD (attention deficit disorder with hyperactivity)    Anxiety    Asthma    Bipolar 1 disorder (HCC)    Migraines     Objective BP 126/74 (BP Location: Left Arm, Patient Position: Sitting)   Pulse 98   Temp 98 F (36.7 C) (Oral)   Resp 16   Ht 5' 2 (1.575 m)   Wt 128 lb 3.2 oz (58.2 kg)   SpO2 98%   BMI 23.45 kg/m  General: Awake, alert, appears stated age HEENT: AT, [place], ears patent b/l and TM's neg, nares patent w/ b/l clear discharge, pharynx pink and without exudates, MMM, no sinus ttp b/l over FS; +TTP over max sinuses b/l Neck: No masses or asymmetry Heart: RRR Lungs: CTAB, no accessory muscle use Psych: Age appropriate judgment and insight, normal mood and affect  Mild intermittent asthma with acute exacerbation  Mild persistent asthma without complication - Plan: For home use only DME Nebulizer machine  Acute maxillary sinusitis, recurrence not specified  Exacerbation of chronic issue. Depomedrol 80 mg today. Airspura q 4 hrs while awake. Supportive care. Send message in 2-3 d if no better. Continue to push fluids, practice good hand hygiene, cover mouth when coughing. F/u prn. If starting to experience worsening symptoms, shaking, or shortness of breath, seek immediate care. Pt voiced understanding and agreement to the plan.  Mabel [place], DO 01/13/25 11:27 AM

## 2025-05-31 ENCOUNTER — Encounter: Payer: MEDICAID | Admitting: Family Medicine
